# Patient Record
Sex: Female | Born: 1965 | Race: Black or African American | Hispanic: No | Marital: Single | Smoking: Never smoker
Health system: Southern US, Community
[De-identification: ages and names within clinical notes are randomized; demographics above are authoritative.]

## PROBLEM LIST (undated history)

## (undated) DIAGNOSIS — J189 Pneumonia, unspecified organism: Secondary | ICD-10-CM

## (undated) DIAGNOSIS — E538 Deficiency of other specified B group vitamins: Secondary | ICD-10-CM

## (undated) DIAGNOSIS — T4145XA Adverse effect of unspecified anesthetic, initial encounter: Secondary | ICD-10-CM

## (undated) DIAGNOSIS — F5104 Psychophysiologic insomnia: Secondary | ICD-10-CM

## (undated) DIAGNOSIS — J45909 Unspecified asthma, uncomplicated: Secondary | ICD-10-CM

## (undated) DIAGNOSIS — Z8673 Personal history of transient ischemic attack (TIA), and cerebral infarction without residual deficits: Secondary | ICD-10-CM

## (undated) DIAGNOSIS — G43709 Chronic migraine without aura, not intractable, without status migrainosus: Secondary | ICD-10-CM

## (undated) DIAGNOSIS — G510 Bell's palsy: Secondary | ICD-10-CM

## (undated) DIAGNOSIS — G4733 Obstructive sleep apnea (adult) (pediatric): Secondary | ICD-10-CM

## (undated) DIAGNOSIS — R51 Headache: Secondary | ICD-10-CM

## (undated) DIAGNOSIS — I639 Cerebral infarction, unspecified: Secondary | ICD-10-CM

## (undated) DIAGNOSIS — R519 Headache, unspecified: Secondary | ICD-10-CM

## (undated) DIAGNOSIS — T8859XA Other complications of anesthesia, initial encounter: Secondary | ICD-10-CM

## (undated) DIAGNOSIS — K219 Gastro-esophageal reflux disease without esophagitis: Secondary | ICD-10-CM

## (undated) HISTORY — PX: ABDOMINAL HYSTERECTOMY: SHX81

## (undated) HISTORY — PX: PARTIAL HYSTERECTOMY: SHX80

## (undated) HISTORY — DX: Chronic migraine without aura, not intractable, without status migrainosus: G43.709

## (undated) HISTORY — DX: Psychophysiologic insomnia: F51.04

## (undated) HISTORY — DX: Headache: R51

## (undated) HISTORY — PX: THYROIDECTOMY, PARTIAL: SHX18

## (undated) HISTORY — DX: Obstructive sleep apnea (adult) (pediatric): G47.33

## (undated) HISTORY — DX: Gastro-esophageal reflux disease without esophagitis: K21.9

## (undated) HISTORY — DX: Headache, unspecified: R51.9

## (undated) HISTORY — DX: Bell's palsy: G51.0

## (undated) HISTORY — DX: Personal history of transient ischemic attack (TIA), and cerebral infarction without residual deficits: Z86.73

## (undated) HISTORY — DX: Deficiency of other specified B group vitamins: E53.8

---

## 1987-06-19 DIAGNOSIS — I639 Cerebral infarction, unspecified: Secondary | ICD-10-CM

## 1987-06-19 HISTORY — DX: Cerebral infarction, unspecified: I63.9

## 2004-07-27 ENCOUNTER — Encounter: Admission: RE | Admit: 2004-07-27 | Discharge: 2004-07-27 | Payer: Self-pay | Admitting: Internal Medicine

## 2007-07-06 ENCOUNTER — Emergency Department (HOSPITAL_COMMUNITY): Admission: EM | Admit: 2007-07-06 | Discharge: 2007-07-06 | Payer: Self-pay | Admitting: Emergency Medicine

## 2011-06-19 DIAGNOSIS — J189 Pneumonia, unspecified organism: Secondary | ICD-10-CM

## 2011-06-19 HISTORY — DX: Pneumonia, unspecified organism: J18.9

## 2011-10-02 ENCOUNTER — Other Ambulatory Visit: Payer: Self-pay | Admitting: Family Medicine

## 2011-10-02 DIAGNOSIS — R600 Localized edema: Secondary | ICD-10-CM

## 2011-10-03 ENCOUNTER — Ambulatory Visit
Admission: RE | Admit: 2011-10-03 | Discharge: 2011-10-03 | Disposition: A | Discharge status: Home / Self Care | Payer: BC Managed Care – PPO | Source: Ambulatory Visit | Attending: Family Medicine | Admitting: Family Medicine

## 2011-10-03 DIAGNOSIS — R600 Localized edema: Secondary | ICD-10-CM

## 2013-05-23 ENCOUNTER — Encounter (HOSPITAL_COMMUNITY): Payer: Self-pay | Admitting: Emergency Medicine

## 2013-05-23 ENCOUNTER — Emergency Department (INDEPENDENT_AMBULATORY_CARE_PROVIDER_SITE_OTHER): Payer: PRIVATE HEALTH INSURANCE

## 2013-05-23 ENCOUNTER — Emergency Department (INDEPENDENT_AMBULATORY_CARE_PROVIDER_SITE_OTHER)
Admission: EM | Admit: 2013-05-23 | Discharge: 2013-05-23 | Disposition: A | Discharge status: Home / Self Care | Payer: PRIVATE HEALTH INSURANCE | Source: Home / Self Care | Attending: Emergency Medicine | Admitting: Emergency Medicine

## 2013-05-23 DIAGNOSIS — S39012A Strain of muscle, fascia and tendon of lower back, initial encounter: Secondary | ICD-10-CM

## 2013-05-23 DIAGNOSIS — J45901 Unspecified asthma with (acute) exacerbation: Secondary | ICD-10-CM

## 2013-05-23 DIAGNOSIS — S335XXA Sprain of ligaments of lumbar spine, initial encounter: Secondary | ICD-10-CM

## 2013-05-23 HISTORY — DX: Unspecified asthma, uncomplicated: J45.909

## 2013-05-23 MED ORDER — ALBUTEROL SULFATE HFA 108 (90 BASE) MCG/ACT IN AERS: 1.0000 | INHALATION_SPRAY | RESPIRATORY_TRACT | Status: DC | PRN

## 2013-05-23 MED ORDER — PREDNISONE 20 MG PO TABS
ORAL_TABLET | ORAL | Status: AC
  Filled 2013-05-23: qty 3

## 2013-05-23 MED ORDER — PREDNISONE 20 MG PO TABS: 40.0000 mg | ORAL_TABLET | Freq: Every day | ORAL | Status: DC

## 2013-05-23 MED ORDER — PREDNISONE 20 MG PO TABS
60.0000 mg | ORAL_TABLET | Freq: Once | ORAL | Status: AC
  Administered 2013-05-23: 60 mg via ORAL

## 2013-05-23 MED ORDER — CYCLOBENZAPRINE HCL 10 MG PO TABS: 10.0000 mg | ORAL_TABLET | Freq: Three times a day (TID) | ORAL | Status: DC | PRN
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
## 2013-05-29 NOTE — ED Provider Notes (Signed)
Medical screening examination/treatment/procedure(s) were performed by non-physician practitioner and as supervising physician I was immediately available for consultation/collaboration.  Leslee Home, M.D.  Reuben Likes, MD 05/29/13 2135
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
## 2013-07-07 ENCOUNTER — Encounter (HOSPITAL_COMMUNITY): Payer: Self-pay | Admitting: Emergency Medicine

## 2013-07-07 ENCOUNTER — Emergency Department (INDEPENDENT_AMBULATORY_CARE_PROVIDER_SITE_OTHER): Payer: Self-pay

## 2013-07-07 ENCOUNTER — Emergency Department (INDEPENDENT_AMBULATORY_CARE_PROVIDER_SITE_OTHER)
Admission: EM | Admit: 2013-07-07 | Discharge: 2013-07-07 | Disposition: A | Discharge status: Home / Self Care | Payer: Self-pay | Source: Home / Self Care

## 2013-07-07 DIAGNOSIS — J069 Acute upper respiratory infection, unspecified: Secondary | ICD-10-CM

## 2013-07-07 MED ORDER — ALBUTEROL SULFATE HFA 108 (90 BASE) MCG/ACT IN AERS: 1.0000 | INHALATION_SPRAY | Freq: Four times a day (QID) | RESPIRATORY_TRACT | Status: DC | PRN

## 2013-07-07 MED ORDER — DEXTROMETHORPHAN POLISTIREX 30 MG/5ML PO LQCR: 30.0000 mg | Freq: Two times a day (BID) | ORAL | Status: DC | PRN

## 2013-07-07 MED ORDER — IPRATROPIUM BROMIDE 0.06 % NA SOLN: 2.0000 | Freq: Four times a day (QID) | NASAL | Status: DC

## 2013-07-07 NOTE — ED Provider Notes (Cosign Needed)
CSN: [id number]     Arrival date & time 07/07/13  1741 History   None    Chief Complaint  Patient presents with  . URI   (Consider location/radiation/quality/duration/timing/severity/associated sxs/prior Treatment) Patient is a 48 y.o. female presenting with URI.  URI Presenting symptoms: congestion, cough, fever and rhinorrhea   Severity:  Moderate Onset quality:  Gradual Duration:  4 days Progression:  Unchanged Chronicity:  New Associated symptoms: headaches and swollen glands   Associated symptoms: no arthralgias, no myalgias, no neck pain, no sinus pain, no sneezing and no wheezing   Associated symptoms comment:  +chills Risk factors: sick contacts   Risk factors: no immunosuppression   Risk factors comment:  +friend ill with same   Past Medical History  Diagnosis Date  . Asthma    Past Surgical History  Procedure Laterality Date  . Partial hysterectomy    . Abdominal hysterectomy    . Thyroidectomy, partial     History reviewed. No pertinent family history. History  Substance Use Topics  . Smoking status: Never Smoker   . Smokeless tobacco: Not on file  . Alcohol Use: No   OB History   Grav Para Term Preterm Abortions TAB SAB Ect Mult Living                 Review of Systems  Constitutional: Positive for fever.  HENT: Positive for congestion and rhinorrhea. Negative for sneezing.   Respiratory: Positive for cough. Negative for wheezing.   Musculoskeletal: Negative for arthralgias, myalgias and neck pain.  Neurological: Positive for headaches.  All other systems reviewed and are negative.    Allergies  Review of patient's allergies indicates no known allergies.  Home Medications   Current Outpatient Rx  Name  Route  Sig  Dispense  Refill  . albuterol (PROVENTIL HFA;VENTOLIN HFA) 108 (90 BASE) MCG/ACT inhaler   Inhalation   Inhale 1-2 puffs into the lungs every 4 (four) hours as needed for wheezing or shortness of breath (and or cough).   1  Inhaler   0   . Pseudoeph-Doxylamine-DM-APAP (NYQUIL PO)   Oral   Take by mouth.         . Albuterol Sulfate (VENTOLIN HFA IN)   Inhalation   Inhale into the lungs.         . cyclobenzaprine (FLEXERIL) 10 MG tablet   Oral   Take 1 tablet (10 mg total) by mouth 3 (three) times daily as needed for muscle spasms.   20 tablet   0   . mometasone (NASONEX) 50 MCG/ACT nasal spray   Nasal   Place 2 sprays into the nose daily.         . predniSONE (DELTASONE) 20 MG tablet   Oral   Take 2 tablets (40 mg total) by mouth daily with breakfast. For 5 days   10 tablet   0   . Pseudoeph-CPM-DM-APAP (TYLENOL COLD PO)   Oral   Take by mouth as needed.          BP 125/89  Pulse 93  Temp(Src) 100.2 F (37.9 C) (Oral)  Resp 24  SpO2 96% Physical Exam  Nursing note and vitals reviewed. Constitutional: She is oriented to person, place, and time. She appears well-developed and well-nourished. No distress.  HENT:  Head: Normocephalic and atraumatic.  Right Ear: Hearing, tympanic membrane, external ear and ear canal normal.  Left Ear: Hearing, tympanic membrane, external ear and ear canal normal.  Nose: Nose normal.  Mouth/Throat: Uvula  is midline, oropharynx is clear and moist and mucous membranes are normal.  Eyes: Conjunctivae are normal. Right eye exhibits no discharge. Left eye exhibits no discharge. No scleral icterus.  Neck: Normal range of motion. Neck supple.  Cardiovascular: Normal rate, regular rhythm and normal heart sounds.   Pulmonary/Chest: Effort normal and breath sounds normal. No respiratory distress. She has no wheezes. She exhibits no tenderness.  Abdominal: Soft. Bowel sounds are normal. She exhibits no distension. There is no tenderness.  Musculoskeletal: Normal range of motion.  Lymphadenopathy:    She has no cervical adenopathy.  Neurological: She is alert and oriented to person, place, and time.  Skin: Skin is warm and dry. No rash noted.  Psychiatric:  She has a normal mood and affect. Her behavior is normal.    ED Course  Procedures (including critical care time) Labs Review Labs Reviewed - No data to display Imaging Review No results found.  EKG Interpretation    Date/Time:    Ventricular Rate:    PR Interval:    QRS Duration:   QT Interval:    QTC Calculation:   R Axis:     Text Interpretation:              MDM  Will provide atrovent nasal spray for congestion and delsym for cough. Tylenol or ibuprofen as directed on packing for fever and body aches. Plenty of fluids and rest. May return to work when 24 hours without fever. Will provide additional Rx for albuterol MDI for use as she recovers from URI.     Jess BartersJennifer Lee MorrowPresson, GeorgiaPA 07/07/13 2055

## 2013-07-07 NOTE — Discharge Instructions (Signed)
Your chest xray was normal. Plenty of fluids and rest. Tylenol or ibuprofen as directed on packaging for fever and body aches.

## 2013-07-07 NOTE — ED Notes (Signed)
C/o cough and congestion in nose and mouth and eyes onset 2 weeks ago.  Fever onset yesterday.  Cough prod. of clear, greenish and occasionally blood tinged.  Ribs hurt from coughing.  C/o pain L side of her neck, throat and head from coughing.

## 2013-09-10 ENCOUNTER — Other Ambulatory Visit: Payer: Self-pay | Admitting: *Deleted

## 2013-09-10 DIAGNOSIS — M7989 Other specified soft tissue disorders: Secondary | ICD-10-CM

## 2013-10-19 ENCOUNTER — Encounter: Payer: Self-pay | Admitting: Vascular Surgery

## 2013-10-20 ENCOUNTER — Ambulatory Visit (HOSPITAL_COMMUNITY): Admission: RE | Admit: 2013-10-20 | Payer: Self-pay | Source: Ambulatory Visit

## 2013-10-20 ENCOUNTER — Encounter: Payer: No Typology Code available for payment source | Admitting: Vascular Surgery

## 2014-07-27 ENCOUNTER — Other Ambulatory Visit: Payer: Self-pay | Admitting: *Deleted

## 2014-07-27 DIAGNOSIS — M7989 Other specified soft tissue disorders: Secondary | ICD-10-CM

## 2014-07-27 DIAGNOSIS — R609 Edema, unspecified: Secondary | ICD-10-CM

## 2014-08-02 ENCOUNTER — Ambulatory Visit: Payer: 59 | Admitting: Neurology

## 2014-08-05 ENCOUNTER — Encounter: Payer: Self-pay | Admitting: Neurology

## 2014-08-05 ENCOUNTER — Ambulatory Visit (INDEPENDENT_AMBULATORY_CARE_PROVIDER_SITE_OTHER): Payer: 59 | Admitting: Neurology

## 2014-08-05 VITALS — BP 136/78 | HR 79 | Ht 65.0 in | Wt 222.6 lb

## 2014-08-05 DIAGNOSIS — G43709 Chronic migraine without aura, not intractable, without status migrainosus: Secondary | ICD-10-CM

## 2014-08-05 DIAGNOSIS — F5104 Psychophysiologic insomnia: Secondary | ICD-10-CM | POA: Insufficient documentation

## 2014-08-05 DIAGNOSIS — G4700 Insomnia, unspecified: Secondary | ICD-10-CM

## 2014-08-05 DIAGNOSIS — IMO0002 Reserved for concepts with insufficient information to code with codable children: Secondary | ICD-10-CM

## 2014-08-05 HISTORY — DX: Reserved for concepts with insufficient information to code with codable children: IMO0002

## 2014-08-05 MED ORDER — SUMATRIPTAN SUCCINATE 100 MG PO TABS: 100.0000 mg | ORAL_TABLET | Freq: Once | ORAL | Status: AC | PRN

## 2014-08-05 MED ORDER — NORTRIPTYLINE HCL 10 MG PO CAPS: ORAL_CAPSULE | ORAL | Status: AC

## 2014-08-05 NOTE — Progress Notes (Signed)
Reason for visit: Migraine headache  Donna Murray is a 49 y.o. female  History of present illness:  Ms. Donna Murray is a 49 year old left-handed black female with a history of headaches that date back about 10 years. The patient indicates that her headaches are daily in nature, and she will wake up with a headache, and headache is worse as the day goes on. Some days are worse than others with the headache. She indicates that the headaches are always on the right temporal and parietal area, and are associated with throbbing sensation, occasional pressure sensations. She will occasionally have some neck stiffness. Bright lights and certain odors may worsen headache. She may have photophobia and phonophobia with the headache. She has developed some tinnitus in the right ear. She has recently has some swelling in the left leg. She denies any bowel or bladder control issues, balance problems, or numbness or weakness of the extremities. She indicates that she has had Bell's palsy on the right previously, and she was told she had a stroke during her pregnancy associated with high blood pressure. 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 The patient is not taking any medications for the headache. She comes to this office for an evaluation. The patient does report some chronic issues with insomnia. She also reports some issues with depression.  Past Medical History  Diagnosis Date  . Asthma   . Headache   . Chronic migraine 08/05/2014  . Chronic insomnia   . B12 deficiency   . GERD (gastroesophageal reflux disease)   . Bell's palsy     right  . History of stroke     right hemisensory deficit    Past Surgical History  Procedure Laterality Date  . Partial hysterectomy    . Abdominal hysterectomy    . Thyroidectomy, partial      Family History  Problem Relation Age of Onset  . Brain cancer Mother   . Hypertension Father   . Diabetes Father   . Heart disease Father   .  Migraines Sister   . Brain cancer Maternal Aunt   . Brain cancer Maternal Aunt   . Migraines Sister     Social history:  reports that she has never smoked. She has never used smokeless tobacco. She reports that she drinks alcohol. She reports that she does not use illicit drugs.  Medications:  Current Outpatient Prescriptions on File Prior to Visit  Medication Sig Dispense Refill  . albuterol (PROVENTIL HFA;VENTOLIN HFA) 108 (90 BASE) MCG/ACT inhaler Inhale 1-2 puffs into the lungs every 6 (six) hours as needed for wheezing or shortness of breath. 1 Inhaler 0  . cyclobenzaprine (FLEXERIL) 10 MG tablet Take 1 tablet (10 mg total) by mouth 3 (three) times daily as needed for muscle spasms. 20 tablet 0  . mometasone (NASONEX) 50 MCG/ACT nasal spray Place 2 sprays into the nose daily.     No current facility-administered medications on file prior to visit.     No Known Allergies  ROS:  Out of a complete 14 system review of symptoms, the patient complains only of the following symptoms, and all other reviewed systems are negative.  Weight gain, fatigue Swelling in the legs Hearing loss, ringing the ears, spinning sensations Blurred vision, double vision, eye pain Snoring Feeling hot, increased thirst Muscle cramps, aching muscles Allergies, runny nose Memory loss, headache, numbness, dizziness Depression, not enough sleep, decreased energy, change in appetite, racing thoughts Sleepiness  Blood pressure 136/78, pulse  79, height  (1.651 m), weight 222 lb 9.6 oz (100.971 kg).  Physical Exam  General: The patient is alert and cooperative at the time of the examination. The patient is minimally to moderately obese.  Eyes: Pupils are equal, round, and reactive to light. Discs are flat bilaterally.  Neck: The neck is supple, no carotid bruits are noted.  Respiratory: The respiratory examination is clear.  Cardiovascular: The cardiovascular examination reveals a regular rate  and rhythm, no obvious murmurs or rubs are noted.  Skin: Extremities are with 1+ edema below the knee on the right, 2+ edema on the left.  Neurologic Exam  Mental status: The patient is alert and oriented x 3 at the time of the examination. The patient has apparent normal recent and remote memory, with an apparently normal attention span and concentration ability.  Cranial nerves: Facial symmetry is not present. There is a relative depression of the left nasolabial fold as compared to the right. There is good sensation of the face to pinprick and soft touch bilaterally. The strength of the facial muscles and the muscles to head turning and shoulder shrug are normal bilaterally. Speech is well enunciated, no aphasia or dysarthria is noted. Extraocular movements are full. Visual fields are full. The tongue is midline, and the patient has symmetric elevation of the soft palate. No obvious hearing deficits are noted.  Motor: The motor testing reveals 5 over 5 strength of all 4 extremities. Good symmetric motor tone is noted throughout.  Sensory: Sensory testing is intact to pinprick, soft touch, vibration sensation, and position sense on all 4 extremities. No evidence of extinction is noted.  Coordination: Cerebellar testing reveals good finger-nose-finger and heel-to-shin bilaterally.  Gait and station: Gait is normal. Tandem gait is normal. Romberg is negative. No drift is seen.  Reflexes: Deep tendon reflexes are symmetric and normal bilaterally. Toes are downgoing bilaterally.   Assessment/Plan:  1. Migraine headache  2. Chronic insomnia  3. History of stroke  The patient is having chronic daily headaches. Given the history of stroke, and the fact that the headaches are always on the right side, the patient will be set up for MRI evaluation of the brain. She will be placed on nortriptyline at night for the headache, and Imitrex will be taken if needed for severe headaches. The patient  will follow-up in about 4 months. The patient will contact our office if she cannot tolerate the medication.   Marlan Palau MD 08/05/2014 8:37 PM  [facility] [address] [place], [place] [address]  Phone [phone] Fax [phone]

## 2014-08-05 NOTE — Patient Instructions (Addendum)
 Pamelor (nortriptyline) is an antidepressant medication that has many uses that may include headache, whiplash injuries, or for peripheral neuropathy pain. Side effects may include drowsiness, dry mouth, blurred vision, or constipation. As with any antidepressant medication, worsening depression may occur. If you had any significant side effects, please call our office. The full effects of this medication may take 7-10 days after starting the drug, or going up on the dose.  Migraine Headache A migraine headache is an intense, throbbing pain on one or both sides of your head. A migraine can last for 30 minutes to several hours. CAUSES  The exact cause of a migraine headache is not always known. However, a migraine may be caused when nerves in the brain become irritated and release chemicals that cause inflammation. This causes pain. Certain things may also trigger migraines, such as:  Alcohol.  Smoking.  Stress.  Menstruation.  Aged cheeses.  Foods or drinks that contain nitrates, glutamate, aspartame, or tyramine.  Lack of sleep.  Chocolate.  Caffeine.  Hunger.  Physical exertion.  Fatigue.  Medicines used to treat chest pain (nitroglycerine), birth control pills, estrogen, and some blood pressure medicines. SIGNS AND SYMPTOMS  Pain on one or both sides of your head.  Pulsating or throbbing pain.  Severe pain that prevents daily activities.  Pain that is aggravated by any physical activity.  Nausea, vomiting, or both.  Dizziness.  Pain with exposure to bright lights, loud noises, or activity.  General sensitivity to bright lights, loud noises, or smells. Before you get a migraine, you may get warning signs that a migraine is coming (aura). An aura may include:  Seeing flashing lights.  Seeing bright spots, halos, or zigzag lines.  Having tunnel vision or blurred vision.  Having feelings of numbness or tingling.  Having trouble talking.  Having muscle  weakness. DIAGNOSIS  A migraine headache is often diagnosed based on:  Symptoms.  Physical exam.  A CT scan or MRI of your head. These imaging tests cannot diagnose migraines, but they can help rule out other causes of headaches. TREATMENT Medicines may be given for pain and nausea. Medicines can also be given to help prevent recurrent migraines.  HOME CARE INSTRUCTIONS  Only take over-the-counter or prescription medicines for pain or discomfort as directed by your health care provider. The use of long-term narcotics is not recommended.  Lie down in a dark, quiet room when you have a migraine.  Keep a journal to find out what may trigger your migraine headaches. For example, write down:  What you eat and drink.  How much sleep you get.  Any change to your diet or medicines.  Limit alcohol consumption.  Quit smoking if you smoke.  Get 7-9 hours of sleep, or as recommended by your health care provider.  Limit stress.  Keep lights dim if bright lights bother you and make your migraines worse. SEEK IMMEDIATE MEDICAL CARE IF:   Your migraine becomes severe.  You have a fever.  You have a stiff neck.  You have vision loss.  You have muscular weakness or loss of muscle control.  You start losing your balance or have trouble walking.  You feel faint or pass out.  You have severe symptoms that are different from your first symptoms. MAKE SURE YOU:   Understand these instructions.  Will watch your condition.  Will get help right away if you are not doing well or get worse. Document Released: 06/04/2005 Document Revised: 10/19/2013 Document Reviewed: 02/09/2013 [employer]   Patient Information 2015 [employer]. This information is not intended to replace advice given to you by your health care provider. Make sure you discuss any questions you have with your health care provider.  

## 2014-08-11 ENCOUNTER — Ambulatory Visit (INDEPENDENT_AMBULATORY_CARE_PROVIDER_SITE_OTHER): Payer: 59

## 2014-08-11 DIAGNOSIS — IMO0002 Reserved for concepts with insufficient information to code with codable children: Secondary | ICD-10-CM

## 2014-08-11 DIAGNOSIS — G43709 Chronic migraine without aura, not intractable, without status migrainosus: Secondary | ICD-10-CM | POA: Diagnosis not present

## 2014-08-16 ENCOUNTER — Telehealth: Payer: Self-pay | Admitting: Neurology

## 2014-08-16 NOTE — Telephone Encounter (Signed)
I called patient. MRI the brain was completely normal. I discussed this with her.   MRI brain 08/16/14:  IMPRESSION: This is a normal noncontrasted MRI of the brain

## 2014-08-18 ENCOUNTER — Encounter: Payer: Self-pay | Admitting: Vascular Surgery

## 2014-08-19 ENCOUNTER — Ambulatory Visit (INDEPENDENT_AMBULATORY_CARE_PROVIDER_SITE_OTHER): Payer: 59 | Admitting: Vascular Surgery

## 2014-08-19 ENCOUNTER — Other Ambulatory Visit: Payer: Self-pay

## 2014-08-19 ENCOUNTER — Encounter: Payer: Self-pay | Admitting: Vascular Surgery

## 2014-08-19 ENCOUNTER — Ambulatory Visit (HOSPITAL_COMMUNITY)
Admission: RE | Admit: 2014-08-19 | Discharge: 2014-08-19 | Disposition: A | Discharge status: Home / Self Care | Payer: 59 | Source: Ambulatory Visit | Attending: Vascular Surgery | Admitting: Vascular Surgery

## 2014-08-19 VITALS — BP 135/85 | HR 82 | Temp 98.1°F | Resp 18 | Ht 65.0 in | Wt 222.0 lb

## 2014-08-19 DIAGNOSIS — I872 Venous insufficiency (chronic) (peripheral): Secondary | ICD-10-CM | POA: Diagnosis not present

## 2014-08-19 DIAGNOSIS — M7989 Other specified soft tissue disorders: Secondary | ICD-10-CM

## 2014-08-19 DIAGNOSIS — R609 Edema, unspecified: Secondary | ICD-10-CM

## 2014-08-19 NOTE — Progress Notes (Signed)
[facility] [place] HISTORY AND PHYSICAL   History of Present Illness:  Patient is a 49 y.o. year old female who presents for evaluation of chronic left leg swelling.  The patient states she has had swelling in her left leg has been present for several years. This began after having her fourth child several years ago. However, she states that in the last year the swelling has become worse. She has been wearing compression stockings for approximately one year. She denies any prior history of DVT. She denies any prior history of lower extremity injury. She does not really complain of varicose veins. She does have some pain in her left foot on the arch area when walking. She works 3 jobs and is on her feet all day and her legs become progressively more tired heavily and fatigued as the day progresses. 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 These are all currently stable.  Past Medical History  Diagnosis Date  . Asthma   . Headache   . Chronic migraine 08/05/2014  . Chronic insomnia   . B12 deficiency   . GERD (gastroesophageal reflux disease)   . Bell's palsy     right  . History of stroke     right hemisensory deficit    Past Surgical History  Procedure Laterality Date  . Partial hysterectomy    . Abdominal hysterectomy    . Thyroidectomy, partial      Social History History  Substance Use Topics  . Smoking status: Never Smoker   . Smokeless tobacco: Never Used  . Alcohol Use: Yes     Comment: occasional wine    Family History Family History  Problem Relation Age of Onset  . Brain cancer Mother   . Hypertension Father   . Diabetes Father   . Heart disease Father   . Migraines Sister   . Brain cancer Maternal Aunt   . Brain cancer Maternal Aunt   . Migraines Sister     Allergies  No Known Allergies   Current Outpatient Prescriptions  Medication Sig Dispense Refill  . albuterol  (PROVENTIL HFA;VENTOLIN HFA) 108 (90 BASE) MCG/ACT inhaler Inhale 1-2 puffs into the lungs every 6 (six) hours as needed for wheezing or shortness of breath. 1 Inhaler 0  . cyclobenzaprine (FLEXERIL) 10 MG tablet Take 1 tablet (10 mg total) by mouth 3 (three) times daily as needed for muscle spasms. 20 tablet 0  . furosemide (LASIX) 20 MG tablet Take 20 mg by mouth 2 (two) times daily.   3  . loratadine (CLARITIN) 10 MG tablet Take 10 mg by mouth daily.    . mometasone (NASONEX) 50 MCG/ACT nasal spray Place 2 sprays into the nose daily.    . montelukast (SINGULAIR) 10 MG tablet Take 10 mg by mouth at bedtime.    . nortriptyline (PAMELOR) 10 MG capsule Take one capsule at night for one week, then take 2 capsules at night for one week, then take 3 capsules at night 90 capsule 3  . omeprazole (PRILOSEC) 40 MG capsule Take 40 mg by mouth daily.  0  . potassium chloride (MICRO-K) 10 MEQ CR capsule Take 10 mEq by mouth daily.  3  . SUMAtriptan (IMITREX) 100 MG tablet Take 1 tablet (100 mg total) by mouth once as needed for migraine or headache. 10 tablet 2   No current facility-administered medications for this visit.    ROS:   General:  No weight loss, Fever, chills  HEENT: No recent headaches, no nasal bleeding, no visual changes, no sore throat  Neurologic: No dizziness, blackouts, seizures. No recent symptoms of stroke or mini- stroke. No recent episodes of slurred speech, or temporary blindness.  Cardiac: No recent episodes of chest pain/pressure, no shortness of breath at rest.  No shortness of breath with exertion.  Denies history of atrial fibrillation or irregular heartbeat  Vascular: No history of rest pain in feet.  No history of claudication.  No history of non-healing ulcer, No history of DVT   Pulmonary: No home oxygen, no productive cough, no hemoptysis,  No asthma or wheezing  Musculoskeletal:  [ ]  Arthritis, [ ]  Low back pain,  [ ]  Joint pain  Hematologic:No history of  hypercoagulable state.  No history of easy bleeding.  No history of anemia  Gastrointestinal: No hematochezia or melena,  No gastroesophageal reflux, no trouble swallowing  Urinary: [ ]  chronic Kidney disease, [ ]  on HD - [ ]  MWF or [ ]  TTHS, [ ]  Burning with urination, [ ]  Frequent urination, [ ]  Difficulty urinating;   Skin: No rashes  Psychological: No history of anxiety,  No history of depression   Physical Examination  Filed Vitals:   08/19/14 1157  BP: 135/85  Pulse: 82  Temp: 98.1 F (36.7 C)  TempSrc: Oral  Resp: 18  Height: 5\' 5"  (1.651 m)  Weight: 222 lb (100.699 kg)  SpO2: 100%    Body mass index is 36.94 kg/(m^2).  General:  Alert and oriented, no acute distress HEENT: Normal Neck: No bruit or JVD Pulmonary: Clear to auscultation bilaterally Cardiac: Regular Rate and Rhythm without murmur Abdomen: Soft, non-tender, non-distended, no mass Skin: No rash Extremity Pulses:  2+ radial, brachial, femoral, dorsalis pedis, posterior tibial pulses bilaterally Musculoskeletal: Left leg circumference proximal mid 20% larger than the left there is some mild pitting component she does not exhibit a buffalo hump in the left leg. There is no real squaring of the toes. Neurologic: Upper and lower extremity motor 5/5 and symmetric  DATA:  Patient had a left leg venous reflux exam today which I reviewed and interpreted. There was no DVT. She did have some deep vein reflux in the left popliteal vein. The greater saphenous vein had no reflux.   ASSESSMENT:  Chronic left leg swelling with no significant superficial venous reflux. She does have mild deep vein reflux. 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  PLAN:  Left lower extremity venogram possible angioplasty and stenting of her left iliac vein if a finding is noted. Risks benefits possible, locations and procedure  details were explained the patient today. Risks include but are not limited to bleeding and infection or contrast reaction. Her venogram scheduled for 09/03/2014.  Fabienne Brunsharles Mirel Hundal, MD [facility] [place] Office: [phone] Pager: [phone]

## 2014-08-20 ENCOUNTER — Other Ambulatory Visit: Payer: Self-pay

## 2014-08-24 ENCOUNTER — Encounter (HOSPITAL_COMMUNITY): Payer: Self-pay | Admitting: *Deleted

## 2014-08-24 MED ORDER — SODIUM CHLORIDE 0.9 % IV SOLN: INTRAVENOUS | Status: DC

## 2014-08-24 MED ORDER — DEXTROSE 5 % IV SOLN: 1.5000 g | INTRAVENOUS | Status: DC

## 2014-08-25 ENCOUNTER — Ambulatory Visit (HOSPITAL_COMMUNITY): Payer: 59 | Admitting: Anesthesiology

## 2014-08-25 ENCOUNTER — Ambulatory Visit (HOSPITAL_COMMUNITY)
Admission: RE | Admit: 2014-08-25 | Discharge: 2014-08-25 | Disposition: A | Discharge status: Home / Self Care | Payer: 59 | Source: Ambulatory Visit | Attending: Vascular Surgery | Admitting: Vascular Surgery

## 2014-08-25 ENCOUNTER — Other Ambulatory Visit: Payer: Self-pay | Admitting: *Deleted

## 2014-08-25 ENCOUNTER — Encounter (HOSPITAL_COMMUNITY)
Admission: RE | Disposition: A | Discharge status: Home / Self Care | Payer: Self-pay | Source: Ambulatory Visit | Attending: Vascular Surgery

## 2014-08-25 ENCOUNTER — Encounter (HOSPITAL_COMMUNITY): Payer: Self-pay | Admitting: *Deleted

## 2014-08-25 DIAGNOSIS — Z8673 Personal history of transient ischemic attack (TIA), and cerebral infarction without residual deficits: Secondary | ICD-10-CM | POA: Diagnosis not present

## 2014-08-25 DIAGNOSIS — I80202 Phlebitis and thrombophlebitis of unspecified deep vessels of left lower extremity: Secondary | ICD-10-CM | POA: Diagnosis not present

## 2014-08-25 DIAGNOSIS — G510 Bell's palsy: Secondary | ICD-10-CM | POA: Diagnosis not present

## 2014-08-25 DIAGNOSIS — Z90710 Acquired absence of both cervix and uterus: Secondary | ICD-10-CM | POA: Insufficient documentation

## 2014-08-25 DIAGNOSIS — I871 Compression of vein: Secondary | ICD-10-CM | POA: Diagnosis not present

## 2014-08-25 DIAGNOSIS — Z48812 Encounter for surgical aftercare following surgery on the circulatory system: Secondary | ICD-10-CM

## 2014-08-25 DIAGNOSIS — J45909 Unspecified asthma, uncomplicated: Secondary | ICD-10-CM | POA: Insufficient documentation

## 2014-08-25 DIAGNOSIS — K219 Gastro-esophageal reflux disease without esophagitis: Secondary | ICD-10-CM | POA: Diagnosis not present

## 2014-08-25 DIAGNOSIS — G43909 Migraine, unspecified, not intractable, without status migrainosus: Secondary | ICD-10-CM | POA: Diagnosis not present

## 2014-08-25 HISTORY — PX: ANGIOPLASTY ILLIAC ARTERY: SHX5720

## 2014-08-25 HISTORY — DX: Other complications of anesthesia, initial encounter: T88.59XA

## 2014-08-25 HISTORY — DX: Pneumonia, unspecified organism: J18.9

## 2014-08-25 HISTORY — PX: VENOPLASTY: SHX6195

## 2014-08-25 HISTORY — DX: Adverse effect of unspecified anesthetic, initial encounter: T41.45XA

## 2014-08-25 HISTORY — DX: Cerebral infarction, unspecified: I63.9

## 2014-08-25 LAB — CREATININE, SERUM
Creatinine, Ser: 0.74 mg/dL (ref 0.50–1.10)
GFR calc Af Amer: >90 mL/min (ref >90)

## 2014-08-25 LAB — CBC
HCT: 36.7 % (ref 36.0–46.0)
Hemoglobin: 12 g/dL (ref 12.0–15.0)
MCH: 29.3 pg (ref 26.0–34.0)
MCHC: 32.7 g/dL (ref 30.0–36.0)
MCV: 89.7 fL (ref 78.0–100.0)
Platelets: 219 10*3/uL (ref 150–400)
RBC: 4.09 MIL/uL (ref 3.87–5.11)
RDW: 12.7 % (ref 11.5–15.5)
WBC: 6.5 10*3/uL (ref 4.0–10.5)

## 2014-08-25 LAB — POCT ACTIVATED CLOTTING TIME: ACTIVATED CLOTTING TIME: 116 s

## 2014-08-25 LAB — POCT I-STAT 4, (NA,K, GLUC, HGB,HCT)
Glucose, Bld: 92 mg/dL (ref 70–99)
HCT: 40 % (ref 36.0–46.0)
Hemoglobin: 13.6 g/dL (ref 12.0–15.0)
Potassium: 4.1 mmol/L (ref 3.5–5.1)
Sodium: 141 mmol/L (ref 135–145)

## 2014-08-25 SURGERY — ANGIOPLASTY, VEIN
Anesthesia: Monitor Anesthesia Care | Site: Groin | Laterality: Left

## 2014-08-25 MED ORDER — MIDAZOLAM HCL 2 MG/2ML IJ SOLN
INTRAMUSCULAR | Status: AC
  Filled 2014-08-25: qty 2

## 2014-08-25 MED ORDER — ENOXAPARIN SODIUM 40 MG/0.4ML ~~LOC~~ SOLN: 100.0000 mg | SUBCUTANEOUS | Status: DC

## 2014-08-25 MED ORDER — RIVAROXABAN (XARELTO) VTE STARTER PACK (15 & 20 MG): ORAL_TABLET | ORAL | Status: DC

## 2014-08-25 MED ORDER — ENOXAPARIN (LOVENOX) PATIENT EDUCATION KIT
PACK | Freq: Once | Status: AC
  Administered 2014-08-25: 18:00:00
  Filled 2014-08-25: qty 1

## 2014-08-25 MED ORDER — FENTANYL CITRATE 0.05 MG/ML IJ SOLN
INTRAMUSCULAR | Status: AC
  Filled 2014-08-25: qty 5

## 2014-08-25 MED ORDER — OXYCODONE HCL 5 MG PO TABS: 5.0000 mg | ORAL_TABLET | ORAL | Status: DC | PRN

## 2014-08-25 MED ORDER — METOPROLOL TARTRATE 1 MG/ML IV SOLN
2.0000 mg | INTRAVENOUS | Status: DC | PRN
Start: 2014-08-25 — End: 2014-08-27
  Filled 2014-08-25: qty 5

## 2014-08-25 MED ORDER — LIDOCAINE HCL (PF) 1 % IJ SOLN
INTRAMUSCULAR | Status: AC
  Filled 2014-08-25: qty 30

## 2014-08-25 MED ORDER — RIVAROXABAN 20 MG PO TABS: 20.0000 mg | ORAL_TABLET | Freq: Every day | ORAL | Status: DC

## 2014-08-25 MED ORDER — PROTAMINE SULFATE 10 MG/ML IV SOLN
INTRAVENOUS | Status: AC
  Filled 2014-08-25: qty 25

## 2014-08-25 MED ORDER — ENOXAPARIN SODIUM 100 MG/ML ~~LOC~~ SOLN
1.0000 mg/kg | Freq: Two times a day (BID) | SUBCUTANEOUS | Status: DC
  Administered 2014-08-25: 100 mg via SUBCUTANEOUS
  Filled 2014-08-25 (×2): qty 1

## 2014-08-25 MED ORDER — SODIUM CHLORIDE 0.9 % IV SOLN: 500.0000 mL | Freq: Once | INTRAVENOUS | Status: AC | PRN

## 2014-08-25 MED ORDER — CHLORHEXIDINE GLUCONATE CLOTH 2 % EX PADS: 6.0000 | MEDICATED_PAD | Freq: Once | CUTANEOUS | Status: DC

## 2014-08-25 MED ORDER — ONDANSETRON HCL 4 MG/2ML IJ SOLN
INTRAMUSCULAR | Status: AC
  Filled 2014-08-25: qty 2

## 2014-08-25 MED ORDER — HYDRALAZINE HCL 20 MG/ML IJ SOLN
5.0000 mg | INTRAMUSCULAR | Status: DC | PRN
  Filled 2014-08-25: qty 0.25

## 2014-08-25 MED ORDER — SODIUM CHLORIDE 0.9 % IV SOLN: INTRAVENOUS | Status: DC

## 2014-08-25 MED ORDER — MORPHINE SULFATE 2 MG/ML IJ SOLN
2.0000 mg | INTRAMUSCULAR | Status: DC | PRN
  Administered 2014-08-25: 2 mg via INTRAVENOUS

## 2014-08-25 MED ORDER — LACTATED RINGERS IV SOLN: INTRAVENOUS | Status: DC

## 2014-08-25 MED ORDER — PROPOFOL 10 MG/ML IV BOLUS
INTRAVENOUS | Status: AC
  Filled 2014-08-25: qty 20

## 2014-08-25 MED ORDER — LABETALOL HCL 5 MG/ML IV SOLN
10.0000 mg | INTRAVENOUS | Status: DC | PRN
  Filled 2014-08-25: qty 4

## 2014-08-25 MED ORDER — ACETAMINOPHEN 325 MG PO TABS
325.0000 mg | ORAL_TABLET | ORAL | Status: DC | PRN
  Filled 2014-08-25: qty 2

## 2014-08-25 MED ORDER — SODIUM CHLORIDE 0.45 % IV SOLN
INTRAVENOUS | Status: DC
  Administered 2014-08-25: 15:00:00 via INTRAVENOUS

## 2014-08-25 MED ORDER — ALBUTEROL SULFATE HFA 108 (90 BASE) MCG/ACT IN AERS
INHALATION_SPRAY | RESPIRATORY_TRACT | Status: AC
  Filled 2014-08-25: qty 13.4

## 2014-08-25 MED ORDER — ONDANSETRON HCL 4 MG/2ML IJ SOLN
4.0000 mg | Freq: Four times a day (QID) | INTRAMUSCULAR | Status: DC | PRN
  Filled 2014-08-25: qty 2

## 2014-08-25 MED ORDER — ACETAMINOPHEN 325 MG RE SUPP
325.0000 mg | RECTAL | Status: DC | PRN
  Filled 2014-08-25: qty 2

## 2014-08-25 MED ORDER — HEPARIN SODIUM (PORCINE) 1000 UNIT/ML IJ SOLN
INTRAMUSCULAR | Status: AC
  Filled 2014-08-25: qty 1

## 2014-08-25 SURGICAL SUPPLY — 60 items
BANDAGE ESMARK 6X9 LF (GAUZE/BANDAGES/DRESSINGS) IMPLANT
BNDG CMPR 9X6 STRL LF SNTH (GAUZE/BANDAGES/DRESSINGS)
BNDG ESMARK 6X9 LF (GAUZE/BANDAGES/DRESSINGS)
CANISTER SUCTION 2500CC (MISCELLANEOUS) IMPLANT
CATH VISIONS PV .035 IVUS (CATHETERS) ×2 IMPLANT
CLIP TI MEDIUM 24 (CLIP) IMPLANT
CLIP TI WIDE RED SMALL 24 (CLIP) IMPLANT
CORONARY SUCKER SOFT TIP 10052 (MISCELLANEOUS) IMPLANT
COVER SURGICAL LIGHT HANDLE (MISCELLANEOUS) IMPLANT
CUFF TOURNIQUET SINGLE 24IN (TOURNIQUET CUFF) IMPLANT
CUFF TOURNIQUET SINGLE 34IN LL (TOURNIQUET CUFF) IMPLANT
CUFF TOURNIQUET SINGLE 44IN (TOURNIQUET CUFF) IMPLANT
DECANTER SPIKE VIAL GLASS SM (MISCELLANEOUS) IMPLANT
DRAIN SNY WOU (WOUND CARE) IMPLANT
DRAPE X-RAY CASS 24X20 (DRAPES) IMPLANT
DRSG COVADERM 4X10 (GAUZE/BANDAGES/DRESSINGS) IMPLANT
DRSG COVADERM 4X8 (GAUZE/BANDAGES/DRESSINGS) IMPLANT
ELECT REM PT RETURN 9FT ADLT (ELECTROSURGICAL)
ELECTRODE REM PT RTRN 9FT ADLT (ELECTROSURGICAL) IMPLANT
EVACUATOR SILICONE 100CC (DRAIN) IMPLANT
GAUZE SPONGE 4X4 12PLY STRL (GAUZE/BANDAGES/DRESSINGS) IMPLANT
GAUZE SPONGE 4X4 16PLY XRAY LF (GAUZE/BANDAGES/DRESSINGS) ×2 IMPLANT
GLOVE BIO SURGEON STRL SZ7.5 (GLOVE) ×2 IMPLANT
GLOVE ECLIPSE 7.5 STRL STRAW (GLOVE) ×2 IMPLANT
GOWN STRL REUS W/ TWL LRG LVL3 (GOWN DISPOSABLE) ×1 IMPLANT
GOWN STRL REUS W/ TWL XL LVL3 (GOWN DISPOSABLE) ×1 IMPLANT
GOWN STRL REUS W/TWL LRG LVL3 (GOWN DISPOSABLE) ×1
GOWN STRL REUS W/TWL XL LVL3 (GOWN DISPOSABLE) ×1
KIT BASIN OR (CUSTOM PROCEDURE TRAY) IMPLANT
KIT ROOM TURNOVER OR (KITS) IMPLANT
LIQUID BAND (GAUZE/BANDAGES/DRESSINGS) IMPLANT
NS IRRIG 1000ML POUR BTL (IV SOLUTION) IMPLANT
PACK PERIPHERAL VASCULAR (CUSTOM PROCEDURE TRAY) IMPLANT
PAD ARMBOARD 7.5X6 YLW CONV (MISCELLANEOUS) IMPLANT
PADDING CAST COTTON 6X4 STRL (CAST SUPPLIES) IMPLANT
SET COLLECT BLD 21X3/4 12 (NEEDLE) IMPLANT
SHEATH  FLEX ANSEL 8FRX45 (SHEATH) ×1
SHEATH FAST CATH 10F 12CM (SHEATH) ×4 IMPLANT
SHEATH FLEX ANSEL 8FRX45 (SHEATH) ×1 IMPLANT
SPONGE GAUZE 4X4 12PLY STER LF (GAUZE/BANDAGES/DRESSINGS) ×2 IMPLANT
SPONGE SURGIFOAM ABS GEL 100 (HEMOSTASIS) IMPLANT
STAPLER VISISTAT 35W (STAPLE) IMPLANT
STOPCOCK 4 WAY LG BORE MALE ST (IV SETS) IMPLANT
SUT PROLENE 5 0 C 1 24 (SUTURE) IMPLANT
SUT PROLENE 6 0 CC (SUTURE) IMPLANT
SUT PROLENE 7 0 BV 1 (SUTURE) IMPLANT
SUT PROLENE 7 0 BV1 MDA (SUTURE) IMPLANT
SUT SILK 2 0 SH (SUTURE) IMPLANT
SUT SILK 3 0 (SUTURE)
SUT SILK 3-0 18XBRD TIE 12 (SUTURE) IMPLANT
SUT VIC AB 2-0 CTX 36 (SUTURE) IMPLANT
SUT VIC AB 3-0 SH 27 (SUTURE)
SUT VIC AB 3-0 SH 27X BRD (SUTURE) IMPLANT
SUT VIC AB 4-0 PS2 27 (SUTURE) IMPLANT
TAPE CLOTH SURG 4X10 WHT LF (GAUZE/BANDAGES/DRESSINGS) ×2 IMPLANT
TAPE UMBILICAL COTTON 1/8X30 (MISCELLANEOUS) IMPLANT
TRAY FOLEY CATH 16FRSI W/METER (SET/KITS/TRAYS/PACK) IMPLANT
TUBING EXTENTION W/L.L. (IV SETS) IMPLANT
UNDERPAD 30X30 INCONTINENT (UNDERPADS AND DIAPERS) IMPLANT
WATER STERILE IRR 1000ML POUR (IV SOLUTION) IMPLANT

## 2014-08-25 NOTE — H&P (View-Only) (Signed)
[facility] [place] HISTORY AND PHYSICAL   History of Present Illness:  Patient is a 49 y.o. year old female who presents for evaluation of chronic left leg swelling.  The patient states she has had swelling in her left leg has been present for several years. This began after having her fourth child several years ago. However, she states that in the last year the swelling has become worse. She has been wearing compression stockings for approximately one year. She denies any prior history of DVT. She denies any prior history of lower extremity injury. She does not really complain of varicose veins. She does have some pain in her left foot on the arch area when walking. She works 3 jobs and is on her feet all day and her legs become progressively more tired heavily and fatigued as the day progresses. 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 These are all currently stable.  Past Medical History  Diagnosis Date  . Asthma   . Headache   . Chronic migraine 08/05/2014  . Chronic insomnia   . B12 deficiency   . GERD (gastroesophageal reflux disease)   . Bell's palsy     right  . History of stroke     right hemisensory deficit    Past Surgical History  Procedure Laterality Date  . Partial hysterectomy    . Abdominal hysterectomy    . Thyroidectomy, partial      Social History History  Substance Use Topics  . Smoking status: Never Smoker   . Smokeless tobacco: Never Used  . Alcohol Use: Yes     Comment: occasional wine    Family History Family History  Problem Relation Age of Onset  . Brain cancer Mother   . Hypertension Father   . Diabetes Father   . Heart disease Father   . Migraines Sister   . Brain cancer Maternal Aunt   . Brain cancer Maternal Aunt   . Migraines Sister     Allergies  No Known Allergies   Current Outpatient Prescriptions  Medication Sig Dispense Refill  . albuterol  (PROVENTIL HFA;VENTOLIN HFA) 108 (90 BASE) MCG/ACT inhaler Inhale 1-2 puffs into the lungs every 6 (six) hours as needed for wheezing or shortness of breath. 1 Inhaler 0  . cyclobenzaprine (FLEXERIL) 10 MG tablet Take 1 tablet (10 mg total) by mouth 3 (three) times daily as needed for muscle spasms. 20 tablet 0  . furosemide (LASIX) 20 MG tablet Take 20 mg by mouth 2 (two) times daily.   3  . loratadine (CLARITIN) 10 MG tablet Take 10 mg by mouth daily.    . mometasone (NASONEX) 50 MCG/ACT nasal spray Place 2 sprays into the nose daily.    . montelukast (SINGULAIR) 10 MG tablet Take 10 mg by mouth at bedtime.    . nortriptyline (PAMELOR) 10 MG capsule Take one capsule at night for one week, then take 2 capsules at night for one week, then take 3 capsules at night 90 capsule 3  . omeprazole (PRILOSEC) 40 MG capsule Take 40 mg by mouth daily.  0  . potassium chloride (MICRO-K) 10 MEQ CR capsule Take 10 mEq by mouth daily.  3  . SUMAtriptan (IMITREX) 100 MG tablet Take 1 tablet (100 mg total) by mouth once as needed for migraine or headache. 10 tablet 2   No current facility-administered medications for this visit.    ROS:   General:  No weight loss, Fever, chills  HEENT: No recent headaches, no nasal bleeding, no visual changes, no sore throat  Neurologic: No dizziness, blackouts, seizures. No recent symptoms of stroke or mini- stroke. No recent episodes of slurred speech, or temporary blindness.  Cardiac: No recent episodes of chest pain/pressure, no shortness of breath at rest.  No shortness of breath with exertion.  Denies history of atrial fibrillation or irregular heartbeat  Vascular: No history of rest pain in feet.  No history of claudication.  No history of non-healing ulcer, No history of DVT   Pulmonary: No home oxygen, no productive cough, no hemoptysis,  No asthma or wheezing  Musculoskeletal:  [ ]  Arthritis, [ ]  Low back pain,  [ ]  Joint pain  Hematologic:No history of  hypercoagulable state.  No history of easy bleeding.  No history of anemia  Gastrointestinal: No hematochezia or melena,  No gastroesophageal reflux, no trouble swallowing  Urinary: [ ]  chronic Kidney disease, [ ]  on HD - [ ]  MWF or [ ]  TTHS, [ ]  Burning with urination, [ ]  Frequent urination, [ ]  Difficulty urinating;   Skin: No rashes  Psychological: No history of anxiety,  No history of depression   Physical Examination  Filed Vitals:   08/19/14 1157  BP: 135/85  Pulse: 82  Temp: 98.1 F (36.7 C)  TempSrc: Oral  Resp: 18  Height: 5\' 5"  (1.651 m)  Weight: 222 lb (100.699 kg)  SpO2: 100%    Body mass index is 36.94 kg/(m^2).  General:  Alert and oriented, no acute distress HEENT: Normal Neck: No bruit or JVD Pulmonary: Clear to auscultation bilaterally Cardiac: Regular Rate and Rhythm without murmur Abdomen: Soft, non-tender, non-distended, no mass Skin: No rash Extremity Pulses:  2+ radial, brachial, femoral, dorsalis pedis, posterior tibial pulses bilaterally Musculoskeletal: Left leg circumference proximal mid 20% larger than the left there is some mild pitting component she does not exhibit a buffalo hump in the left leg. There is no real squaring of the toes. Neurologic: Upper and lower extremity motor 5/5 and symmetric  DATA:  Patient had a left leg venous reflux exam today which I reviewed and interpreted. There was no DVT. She did have some deep vein reflux in the left popliteal vein. The greater saphenous vein had no reflux.   ASSESSMENT:  Chronic left leg swelling with no significant superficial venous reflux. She does have mild deep vein reflux. 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  PLAN:  Left lower extremity venogram possible angioplasty and stenting of her left iliac vein if a finding is noted. Risks benefits possible, locations and procedure  details were explained the patient today. Risks include but are not limited to bleeding and infection or contrast reaction. Her venogram scheduled for 09/03/2014.  Fabienne Brunsharles Fields, MD [facility] [place] Office: [phone] Pager: [phone]

## 2014-08-25 NOTE — Progress Notes (Signed)
Site area: rt groin Site Prior to Removal:  Level  0 Pressure Applied For: 15 minutes Manual:   yes Patient Status During Pull:  stable Post Pull Site:  Level  0 Post Pull Instructions Given:  yes Post Pull Pulses Present: yes Dressing Applied:  tegaderm  Comments: no complications

## 2014-08-25 NOTE — Op Note (Signed)
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 The patient was placed in supine position on the operating room table. After adequate sedation, both groins were prepped and draped in usual sterile fashion. Ultrasound was used to identify the right common femoral artery. The right common femoral vein was not clearly identified although there was a small lumen present. Local anesthesia was infiltrated over the right common femoral vein. Several passes were made with the introducer needle to try to cannulate the right common femoral vein. The artery was inadvertently cannulated twice. The needle was removed and hemostasis obtained with direct pressure. At this point attention was turned to the right groin. Local anesthesia was insured over the right common femoral vein. This was easily visualized under ultrasound. Initiation was used to cannulate the right common femoral vein and an 035 Bentson were threaded up into the inferior vena cava under fluoroscopic guidance. Next a 5 French sheath was placed over the guidewire the right common femoral vein. This was thoroughly flushed with heparin saline. A 5 French crossover catheter was advanced over the guidewire and this was used to selectively catheterize the left common iliac vein. I then advanced the Bentson wire down into the left common iliac vein and the guidewire tract into the left internal iliac vein. I tried to advance Crosser catheter over this but this would not advance easily. Therefore the Bentson wire was exchanged for an [employer]035  Glidewire. I was then able to advance the Glidewire deep into the right internal iliac vein. A 5 French straight catheter was then placed over the Glidewire into the proximal left common iliac vein. A contrast angiogram was performed. This showed patency of the left common iliac vein. The left intraocular lens ring was also patent. There was severe narrowing of the left external iliac vein at its origin. Using this picture as a roadmap eye was able to direct the 35 angled Glidewire into the left external iliac vein and then down to the level of the left common femoral vein. A 5 French straight catheter was then placed over this. The Glidewire was then exchanged for an 035 Amplatz wire. A 5 French sheath was exchanged over a wire for a 8 [place] Ansell sheath. This was then advanced up and over the caval bifurcation and he Volcano IVUS device was advanced up and over the caval bifurcation into the distal left external iliac vein at the level of the common femoral vein. Measurements of the left iliac venous system were then made using IVUS.  There were 2 distinct areas of narrowing one at the common femoral external iliac vein junction and another one at the takeoff of the external and internal iliac veins. Both of these stenoses were greater than 60%.  At this point ultrasound was then used again to inspect the left groin. I was able to find a more suitable vein window well below the inguinal crease in the common femoral vein which was a wider diameter at this location. Local anesthesia was infiltrated over this. An introducing needle was then used to cannulate the left common femoral vein under ultrasound guidance. An  035 Bentson wire was then advanced up into the left iliac system. There was resistance of the wire at the level of the more distal stenosis. A 10 French sheath was placed over the guidewire and the left common femoral vein. With this extra sheath support was able to advance the guidewire up into the  inferior vena cava. Multiple contrast angiograms as well as repeat iris was performed to determine again the precise location of the 2 stenoses. The patient was given 10,000 units of intravenous heparin. An 18 x 60 Wallstent was selected and this was advanced using roadmapping techniques to the level of stenosis. This was then deployed. Completion angiogram showed that we had missed a portion of the stenosis at the iliac bifurcation. Therefore an additional 18 x 90 mm Wallstent was brought up on the operative field and this was advanced into the common iliac vein to make sure that we had completely covered the area of stenosis and the stent extended down to but not including the left common femoral vein. The stent was then postdilated with a 16 x 40 Atlas balloon with 2 overlapping inflations. Repeat iris showed 0% residual stenosis in the left external iliac vein at this point. Completion angiogram showed similar findings. Patient was given 50 mg protamine. All guidewires and delivery systems were removed but both femoral sheaths left in place to be pulled in the holding area after the ACT is less than 175.  The patient tolerated the procedure well and there were no complications. The patient was taken to the holding area in stable condition.  The patient will be placed on anticoagulation in the form of Lovenox and Xarelto for 3 months and will need a follow-up venogram in 6 months.  Fabienne Bruns, MD [facility] [place] Office: [phone] Pager: [phone]

## 2014-08-25 NOTE — Discharge Instructions (Signed)

## 2014-08-25 NOTE — Progress Notes (Signed)
Site area: lt groin Site Prior to Removal:  Level  0 Pressure Applied For: 20 minutes  Manual:   yes Patient Status During Pull:  stable Post Pull Site:  Level 0 Post Pull Instructions Given:  yes Post Pull Pulses Present: yes Dressing Applied:  tegaderm Bedrest begins @ 1515 Comments: no complications

## 2014-08-25 NOTE — Progress Notes (Signed)
Pain much less. Resting quietly.

## 2014-08-25 NOTE — Progress Notes (Signed)
Sequential LE stockings arrived, placed on both legs and machine turned on. Eating Malaw[place]urkey sandwich. Talking with [facility] [address] and Joyce GrossKay, Charity fundraiserN at Dr. Evelina DunField's office, regarding prescription coverage through patient's health insurance.

## 2014-08-25 NOTE — Interval H&P Note (Signed)
History and Physical Interval Note:  08/25/2014 8:01 AM  Donna Murray  has presented today for surgery, with the diagnosis of Left lower extremity swelling M79.89  The various methods of treatment have been discussed with the patient and family. After consideration of risks, benefits and other options for treatment, the patient has consented to  Procedure(s): LOWER EXTREMITY VENOGRAM (Left) PERCUTANEOUS TRANSLUMINAL ANGIOPLASTY/STENT ILIAC VEIN (Left) as a surgical intervention .  The patient's history has been reviewed, patient examined, no change in status, stable for surgery.  I have reviewed the patient's chart and labs.  Questions were answered to the patient's satisfaction.     [name]

## 2014-08-26 ENCOUNTER — Telehealth: Payer: Self-pay | Admitting: *Deleted

## 2014-08-26 ENCOUNTER — Telehealth: Payer: Self-pay | Admitting: Vascular Surgery

## 2014-08-26 ENCOUNTER — Encounter (HOSPITAL_COMMUNITY): Payer: Self-pay | Admitting: Vascular Surgery

## 2014-08-26 NOTE — Telephone Encounter (Addendum)
-----   Message from Sharee PimpleMarilyn K McChesney, RN sent at 08/25/2014  5:33 PM EST ----- Regarding: Schedule   ----- Message -----    From: Sherren Kernsharles E Fields, MD    Sent: 08/25/2014   2:26 PM      To: [facility] Charge Pool  Left external iliac vein stent Bilateral femoral vein puncture US both groins IVUS  She needs appt with me in 3 months with a venous duplex of the iliac venous stent at that point  Fabienne Brunsharles Fields  notified patient of post op appt. on 11-25-14 at 3pm for vascular lab and then 4pm to see dr. Darrick Pennafields

## 2014-08-26 NOTE — Telephone Encounter (Signed)
Xarelto 20 mg approved per Donna Murray at [facility] [phone]( [phone])   Donna Murray [facility] [phone]; per Donna BonineConnor the NDC# needs to be changed to NDC# [id number] for this Xarelto 20mg  Rx of 1 daily x 90 days. Pharmacist resubmitted the RX and it went through the approval process with the updated NDC#.  [name] has picked up her Lovenox for the 3 day bridge and will begin Xarelto after that.   I tried calling the patient by her home phone just rings and her cell phone's mailbox is full. Will try other contact numbers if available.   Spoke to her friend listed as a contact ( Adele), she will tell [name] to go to pharmacy to pickup her Rx of Xarelto 20 mg. She voiced understanding.

## 2014-09-03 ENCOUNTER — Encounter (HOSPITAL_COMMUNITY): Payer: Self-pay

## 2014-09-03 ENCOUNTER — Ambulatory Visit (HOSPITAL_COMMUNITY): Admit: 2014-09-03 | Payer: Self-pay | Admitting: Vascular Surgery

## 2014-09-03 SURGERY — VENOGRAM
Anesthesia: LOCAL | Laterality: Left

## 2014-11-22 ENCOUNTER — Encounter: Payer: Self-pay | Admitting: Vascular Surgery

## 2014-11-25 ENCOUNTER — Ambulatory Visit (HOSPITAL_COMMUNITY)
Admission: RE | Admit: 2014-11-25 | Discharge: 2014-11-25 | Disposition: A | Discharge status: Home / Self Care | Payer: 59 | Source: Ambulatory Visit | Attending: Vascular Surgery | Admitting: Vascular Surgery

## 2014-11-25 ENCOUNTER — Encounter: Payer: 59 | Admitting: Vascular Surgery

## 2014-11-25 DIAGNOSIS — I871 Compression of vein: Secondary | ICD-10-CM

## 2014-11-25 DIAGNOSIS — Z48812 Encounter for surgical aftercare following surgery on the circulatory system: Secondary | ICD-10-CM

## 2014-11-25 MED ORDER — RIVAROXABAN 20 MG PO TABS: 20.0000 mg | ORAL_TABLET | Freq: Every day | ORAL | Status: DC

## 2014-12-06 ENCOUNTER — Ambulatory Visit: Payer: 59 | Admitting: Nurse Practitioner

## 2014-12-09 ENCOUNTER — Ambulatory Visit: Payer: 59 | Admitting: Nurse Practitioner

## 2014-12-10 ENCOUNTER — Encounter: Payer: Self-pay | Admitting: Nurse Practitioner

## 2014-12-15 ENCOUNTER — Encounter: Payer: Self-pay | Admitting: Family

## 2014-12-16 ENCOUNTER — Ambulatory Visit (HOSPITAL_COMMUNITY)
Admission: RE | Admit: 2014-12-16 | Discharge: 2014-12-16 | Disposition: A | Discharge status: Home / Self Care | Payer: 59 | Source: Ambulatory Visit | Attending: Vascular Surgery | Admitting: Vascular Surgery

## 2014-12-16 ENCOUNTER — Other Ambulatory Visit: Payer: Self-pay | Admitting: Vascular Surgery

## 2014-12-16 ENCOUNTER — Encounter: Payer: Self-pay | Admitting: Vascular Surgery

## 2014-12-16 ENCOUNTER — Ambulatory Visit (INDEPENDENT_AMBULATORY_CARE_PROVIDER_SITE_OTHER): Payer: 59 | Admitting: Vascular Surgery

## 2014-12-16 VITALS — BP 134/93 | HR 88 | Ht 65.0 in | Wt 228.6 lb

## 2014-12-16 DIAGNOSIS — I82422 Acute embolism and thrombosis of left iliac vein: Secondary | ICD-10-CM | POA: Diagnosis not present

## 2014-12-16 DIAGNOSIS — I871 Compression of vein: Secondary | ICD-10-CM

## 2014-12-16 DIAGNOSIS — Z48812 Encounter for surgical aftercare following surgery on the circulatory system: Secondary | ICD-10-CM | POA: Diagnosis not present

## 2014-12-16 MED ORDER — RIVAROXABAN 20 MG PO TABS: 20.0000 mg | ORAL_TABLET | Freq: Every day | ORAL | Status: DC

## 2014-12-16 NOTE — Addendum Note (Signed)
Addended by: Adria DillELDRIDGE-LEWIS, Maite Burlison L on: 12/16/2014 02:46 PM   Modules accepted: Orders

## 2014-12-16 NOTE — Progress Notes (Signed)
Patient is status post left iliac vein stenting on 08/25/2014. She states she still has some swelling in her left leg. However she states that achiness and pain that she had in her left leg is somewhat improved. She is currently on Xarelto daily. She denies any increased swelling in the leg. She has no problems in the right leg.  Past Medical History  Diagnosis Date  . Asthma   . Headache   . Chronic migraine 08/05/2014  . Chronic insomnia   . B12 deficiency   . GERD (gastroesophageal reflux disease)   . Bell's palsy     right  . History of stroke     right hemisensory deficit  . Complication of anesthesia     Woke up during Hysterectomy  . Stroke 1989    "Bells palsy" shows up when very tired- right sie of mouth  . Pneumonia 2013    Past Surgical History  Procedure Laterality Date  . Partial hysterectomy    . Abdominal hysterectomy    . Thyroidectomy, partial    . Venoplasty Left 08/25/2014    Procedure: LOWER EXTREMITY VENOGRAM;  Surgeon: Sherren Kerns, MD;  Location: [facility] OR;  Service: Vascular;  Laterality: Left;  . Angioplasty illiac artery Left 08/25/2014    Procedure: PERCUTANEOUS TRANSLUMINAL ANGIOPLASTY/STENT ILIAC VEIN;  Surgeon: Sherren Kerns, MD;  Location: [facility] OR;  Service: Vascular;  Laterality: Left;    Current Outpatient Prescriptions on File Prior to Visit  Medication Sig Dispense Refill  . albuterol (PROVENTIL HFA;VENTOLIN HFA) 108 (90 BASE) MCG/ACT inhaler Inhale 1-2 puffs into the lungs every 6 (six) hours as needed for wheezing or shortness of breath. 1 Inhaler 0  . furosemide (LASIX) 20 MG tablet Take 20 mg by mouth 2 (two) times daily.   3  . loratadine (CLARITIN) 10 MG tablet Take 10 mg by mouth daily as needed for allergies.     . montelukast (SINGULAIR) 10 MG tablet Take 10 mg by mouth at bedtime as needed (allergies).     . nortriptyline (PAMELOR) 10 MG capsule Take one capsule at night for one week, then take 2 capsules at night for one week, then take  3 capsules at night (Patient taking differently: as needed. Take one capsule at night for one week, then take 2 capsules at night for one week, then take 3 capsules at night) 90 capsule 3  . omeprazole (PRILOSEC) 40 MG capsule Take 40 mg by mouth daily as needed (heartburn).   0  . potassium chloride (MICRO-K) 10 MEQ CR capsule Take 10 mEq by mouth daily.  3  . SUMAtriptan (IMITREX) 100 MG tablet Take 1 tablet (100 mg total) by mouth once as needed for migraine or headache. (Patient taking differently: Take 100 mg by mouth every 2 (two) hours as needed for migraine or headache. ) 10 tablet 2  . ketoconazole (NIZORAL) 2 % shampoo Apply 1 application topically 2 (two) times a week.    . mometasone (NASONEX) 50 MCG/ACT nasal spray Place 2 sprays into the nose daily as needed (allergies).      No current facility-administered medications on file prior to visit.    No Known Allergies  Review of systems: She denies chest pain. She denies shortness of breath.  Physical exam:  Filed Vitals:   12/16/14 1122  BP: 134/93  Pulse: 88  Height:  (1.651 m)  Weight: 228 lb 9.6 oz (103.692 kg)  SpO2: 99%    Left lower extremity: 2+ left  dorsalis pedis pulse left leg circumference approximately 15% larger than the right leg, no hematoma and groin  Abdomen: Soft nontender nondistended no mass  Data: Patient had a duplex ultrasound of her iliac stent today. This was widely patent with no areas of narrowing.  Assessment: Patent left iliac stent with some improvement of pain symptoms overall but persistent left leg swelling.  Plan: Continue Xarelto for 6 more months. We'll consider discontinuing it at that point. Patient will also have a iliac vein duplex at her next office visit. She will call if she has any acute onset of worsening swelling. She will walk as much as possible she has no activity restrictions.  Fabienne Brunsharles Fields, MD [facility] [place] Office:  [phone] Pager: [phone]

## 2015-04-18 ENCOUNTER — Other Ambulatory Visit: Payer: Self-pay | Admitting: Vascular Surgery

## 2015-06-23 ENCOUNTER — Encounter (HOSPITAL_COMMUNITY): Payer: 59

## 2015-06-23 ENCOUNTER — Ambulatory Visit: Payer: 59 | Admitting: Vascular Surgery

## 2015-07-12 ENCOUNTER — Encounter: Payer: Self-pay | Admitting: Vascular Surgery

## 2015-07-21 ENCOUNTER — Ambulatory Visit (HOSPITAL_COMMUNITY)
Admission: RE | Admit: 2015-07-21 | Discharge: 2015-07-21 | Disposition: A | Discharge status: Home / Self Care | Payer: BLUE CROSS/BLUE SHIELD | Source: Ambulatory Visit | Attending: Vascular Surgery | Admitting: Vascular Surgery

## 2015-07-21 ENCOUNTER — Ambulatory Visit (INDEPENDENT_AMBULATORY_CARE_PROVIDER_SITE_OTHER): Payer: BLUE CROSS/BLUE SHIELD | Admitting: Vascular Surgery

## 2015-07-21 ENCOUNTER — Encounter: Payer: Self-pay | Admitting: Vascular Surgery

## 2015-07-21 VITALS — BP 120/73 | HR 81 | Temp 97.3°F | Resp 18 | Ht 65.0 in | Wt 228.5 lb

## 2015-07-21 DIAGNOSIS — I82422 Acute embolism and thrombosis of left iliac vein: Secondary | ICD-10-CM | POA: Diagnosis not present

## 2015-07-21 DIAGNOSIS — M79605 Pain in left leg: Secondary | ICD-10-CM | POA: Diagnosis present

## 2015-07-21 DIAGNOSIS — M7989 Other specified soft tissue disorders: Secondary | ICD-10-CM | POA: Insufficient documentation

## 2015-07-21 NOTE — Progress Notes (Signed)
Patient is status post left iliac vein stenting on 08/25/2014. She states she still has some swelling in her left leg. However she states that achiness and pain that she had in her left leg is somewhat improved. She is currently on Xarelto daily. She did stop this for a few days recently after having some bright red blood per rectum. She denies any increased swelling in the leg. She has no problems in the right leg.    Past Medical History   Diagnosis  Date   .  Asthma     .  Headache     .  Chronic migraine  08/05/2014   .  Chronic insomnia     .  B12 deficiency     .  GERD (gastroesophageal reflux disease)     .  Bell's palsy         right   .  History of stroke         right hemisensory deficit   .  Complication of anesthesia         Woke up during Hysterectomy   .  Stroke  1989       "Bells palsy" shows up when very tired- right sie of mouth   .  Pneumonia  2013       Past Surgical History   Procedure  Laterality  Date   .  Partial hysterectomy       .  Abdominal hysterectomy       .  Thyroidectomy, partial       .  Venoplasty  Left  08/25/2014       Procedure: LOWER EXTREMITY VENOGRAM;  Surgeon: Sherren Kerns, MD;  Location: [facility] OR;  Service: Vascular;  Laterality: Left;   .  Angioplasty illiac artery  Left  08/25/2014       Procedure: PERCUTANEOUS TRANSLUMINAL ANGIOPLASTY/STENT ILIAC VEIN;  Surgeon: Sherren Kerns, MD;  Location: [facility] OR;  Service: Vascular;  Laterality: Left;       Current Outpatient Prescriptions on File Prior to Visit  Medication Sig Dispense Refill  . albuterol (PROVENTIL HFA;VENTOLIN HFA) 108 (90 BASE) MCG/ACT inhaler Inhale 1-2 puffs into the lungs every 6 (six) hours as needed for wheezing or shortness of breath. 1 Inhaler 0  . furosemide (LASIX) 20 MG tablet Take 20 mg by mouth 2 (two) times daily.   3  . ketoconazole (NIZORAL) 2 % shampoo Apply 1 application topically 2 (two) times a week.    . loratadine (CLARITIN) 10 MG tablet Take 10 mg by mouth  daily as needed for allergies.     . mometasone (NASONEX) 50 MCG/ACT nasal spray Place 2 sprays into the nose daily as needed (allergies).     . montelukast (SINGULAIR) 10 MG tablet Take 10 mg by mouth at bedtime as needed (allergies).     . nortriptyline (PAMELOR) 10 MG capsule Take one capsule at night for one week, then take 2 capsules at night for one week, then take 3 capsules at night (Patient taking differently: as needed. Take one capsule at night for one week, then take 2 capsules at night for one week, then take 3 capsules at night) 90 capsule 3  . omeprazole (PRILOSEC) 40 MG capsule Take 40 mg by mouth daily as needed (heartburn).   0  . potassium chloride (MICRO-K) 10 MEQ CR capsule Take 10 mEq by mouth daily.  3  . rivaroxaban (XARELTO) 20 MG TABS tablet Take 1 tablet (20 mg total)  by mouth daily with supper. 90 tablet 2  . SUMAtriptan (IMITREX) 100 MG tablet Take 1 tablet (100 mg total) by mouth once as needed for migraine or headache. (Patient taking differently: Take 100 mg by mouth every 2 (two) hours as needed for migraine or headache. ) 10 tablet 2   No current facility-administered medications on file prior to visit.    No Known Allergies  Review of systems: She denies chest pain. She denies shortness of breath.  Physical exam:    Filed Vitals:   07/21/15 1557  BP: 120/73  Pulse: 81  Temp: 97.3 F (36.3 C)  TempSrc: Oral  Resp: 18  Height:  (1.651 m)  Weight: 228 lb 8 oz (103.647 kg)  SpO2: 97%    Left lower extremity: 2+ left dorsalis pedis pulse left leg circumference approximately 15% larger than the right leg, no hematoma and groin  Abdomen: Soft nontender nondistended no mass  Data: Patient had a duplex ultrasound of her iliac stent today. Stent was not well visualized due to overlying bowel gas. However she had good waveforms in the distal external iliac and common femoral veins suggesting patency. She has chronic deep vein reflux as  well.  Assessment: Patent left iliac stent with some improvement of pain symptoms overall but persistent left leg swelling most likely secondary to deep vein reflux.  Plan: Continue Xarelto as long as she is not having complications from this. Patient will also have a iliac vein duplex at her next office visit. She will call if she has any acute onset of worsening swelling. She will walk as much as possible she has no activity restrictions.  Since she has had some bright red blood per rectum, we will schedule her for evaluation by gastroenterology for possible colonoscopy. She would be able to stop her Xarelto for the colonoscopy if necessary.  Also discussed with the patient today weight loss options. We went over her diet as well as exercise program and possible strategies for losing weight in the future which should help her leg swelling. She will also do continue to wear compression stockings as much as possible.  Fabienne Bruns, MD [facility] [place] Office: [phone] Pager: [phone]

## 2015-07-22 NOTE — Addendum Note (Signed)
Addended by: Adria Dill L on: 07/22/2015 10:19 AM   Modules accepted: Orders

## 2015-07-22 NOTE — Addendum Note (Signed)
Addended by: Adria Dill L on: 07/22/2015 11:06 AM   Modules accepted: Orders

## 2015-08-04 ENCOUNTER — Other Ambulatory Visit: Payer: Self-pay | Admitting: Family Medicine

## 2015-08-04 DIAGNOSIS — Z1231 Encounter for screening mammogram for malignant neoplasm of breast: Secondary | ICD-10-CM

## 2015-10-27 NOTE — Progress Notes (Signed)
This encounter was created in error - please disregard.

## 2016-01-31 ENCOUNTER — Encounter: Payer: Self-pay | Admitting: Vascular Surgery

## 2016-02-02 ENCOUNTER — Ambulatory Visit (HOSPITAL_COMMUNITY)
Admission: RE | Admit: 2016-02-02 | Discharge: 2016-02-02 | Disposition: A | Discharge status: Home / Self Care | Payer: BLUE CROSS/BLUE SHIELD | Source: Ambulatory Visit | Attending: Vascular Surgery | Admitting: Vascular Surgery

## 2016-02-02 ENCOUNTER — Ambulatory Visit (INDEPENDENT_AMBULATORY_CARE_PROVIDER_SITE_OTHER)
Admission: RE | Admit: 2016-02-02 | Discharge: 2016-02-02 | Disposition: A | Discharge status: Home / Self Care | Payer: BLUE CROSS/BLUE SHIELD | Source: Ambulatory Visit | Attending: Vascular Surgery | Admitting: Vascular Surgery

## 2016-02-02 ENCOUNTER — Encounter: Payer: Self-pay | Admitting: Vascular Surgery

## 2016-02-02 ENCOUNTER — Ambulatory Visit (INDEPENDENT_AMBULATORY_CARE_PROVIDER_SITE_OTHER): Payer: BLUE CROSS/BLUE SHIELD | Admitting: Vascular Surgery

## 2016-02-02 VITALS — BP 138/90 | HR 75 | Ht 65.0 in | Wt 239.3 lb

## 2016-02-02 DIAGNOSIS — Z95828 Presence of other vascular implants and grafts: Secondary | ICD-10-CM | POA: Diagnosis not present

## 2016-02-02 DIAGNOSIS — M79601 Pain in right arm: Secondary | ICD-10-CM

## 2016-02-02 DIAGNOSIS — M7989 Other specified soft tissue disorders: Secondary | ICD-10-CM

## 2016-02-02 DIAGNOSIS — M79604 Pain in right leg: Secondary | ICD-10-CM

## 2016-02-02 DIAGNOSIS — I871 Compression of vein: Secondary | ICD-10-CM | POA: Diagnosis not present

## 2016-02-02 NOTE — Addendum Note (Signed)
Addended by: Dannielle KarvonenMANESS-HARRISON, CHANDA C on: 02/02/2016 03:42 PM   Modules accepted: Orders

## 2016-02-02 NOTE — Progress Notes (Signed)
Patient is status post left iliac vein stenting on 08/25/2014. She states she still has some swelling in her left leg. However she states that achiness and pain that she had in her left leg is somewhat improved. She is currently on Xarelto daily. She denies any increased swelling in the leg. Today she also complains of pain in the right leg which extends from the hip all the way down into the foot. She states this is been going on for about for 5 days. She denies any trauma to the extremity. She denies any prior history of DVT on the right side. She states the pain is worse when she walks and in the evenings.        Past Medical History   Diagnosis  Date   .  Asthma     .  Headache     .  Chronic migraine  08/05/2014   .  Chronic insomnia     .  B12 deficiency     .  GERD (gastroesophageal reflux disease)     .  Bell's palsy         right   .  History of stroke         right hemisensory deficit   .  Complication of anesthesia         Woke up during Hysterectomy   .  Stroke  1989       "Bells palsy" shows up when very tired- right sie of mouth   .  Pneumonia  2013             Past Surgical History   Procedure  Laterality  Date   .  Partial hysterectomy       .  Abdominal hysterectomy       .  Thyroidectomy, partial       .  Venoplasty  Left  08/25/2014       Procedure: LOWER EXTREMITY VENOGRAM;  Surgeon: Sherren Kernsharles E Kathlyne Loud, MD;  [facility];  Service: Vascular;  Laterality: Left;   .  Angioplasty illiac artery  Left  08/25/2014       Procedure: PERCUTANEOUS TRANSLUMINAL ANGIOPLASTY/STENT ILIAC VEIN;  Surgeon: Sherren Kernsharles E Dietrick Barris, MD;  [facility];  Service: Vascular;  Laterality: Left;       Current Outpatient Prescriptions on File Prior to Visit  Medication Sig Dispense Refill  . albuterol (PROVENTIL HFA;VENTOLIN HFA) 108 (90 BASE) MCG/ACT inhaler Inhale 1-2 puffs into the lungs every 6 (six) hours as needed for wheezing or shortness of breath. 1 Inhaler 0  . furosemide (LASIX) 20  MG tablet Take 20 mg by mouth 2 (two) times daily.   3  . ketoconazole (NIZORAL) 2 % shampoo Apply 1 application topically 2 (two) times a week.    . loratadine (CLARITIN) 10 MG tablet Take 10 mg by mouth daily as needed for allergies.     . mometasone (NASONEX) 50 MCG/ACT nasal spray Place 2 sprays into the nose daily as needed (allergies).     . montelukast (SINGULAIR) 10 MG tablet Take 10 mg by mouth at bedtime as needed (allergies).     Marland Kitchen. omeprazole (PRILOSEC) 40 MG capsule Take 40 mg by mouth daily as needed (heartburn).   0  . potassium chloride (MICRO-K) 10 MEQ CR capsule Take 10 mEq by mouth daily.  3  . rivaroxaban (XARELTO) 20 MG TABS tablet Take 1 tablet (20 mg total) by mouth daily with supper. 90 tablet 2  . SUMAtriptan (  IMITREX) 100 MG tablet Take 1 tablet (100 mg total) by mouth once as needed for migraine or headache. (Patient taking differently: Take 100 mg by mouth every 2 (two) hours as needed for migraine or headache. ) 10 tablet 2  . nortriptyline (PAMELOR) 10 MG capsule Take one capsule at night for one week, then take 2 capsules at night for one week, then take 3 capsules at night (Patient not taking: Reported on 02/02/2016) 90 capsule 3   No current facility-administered medications on file prior to visit.      No Known Allergies   Review of systems: She denies chest pain. She denies shortness of breath.   Physical exam:    Vitals:   02/02/16 0943  BP: 138/90  Pulse: 75  SpO2: 100%  Weight: 239 lb 4.8 oz (108.5 kg)  Height: 5\' 5"  (1.651 m)     Left lower extremity: 2+ left dorsalis pedis pulse left leg circumference approximately 15% larger than the right leg, no hematoma and groin Right lower extremity: 2+ dorsalis pedis posterior tibial pulse no significant edema pain on palpation posterior right calf and anterior compartment no mass   Abdomen: Soft nontender nondistended no mass   Data: Patient had a duplex ultrasound of her iliac stent today. Stent was  not well visualized due to overlying bowel gas. However she had good waveforms in the distal external iliac and common femoral veins suggesting patency. She has chronic deep vein reflux as well. This is unchanged from her previous scan.  In light of the patient's pain symptoms in her right lower extremity with also did a duplex of her right lower extremity rule out DVT in the showed no evidence of DVT.    Assessment: Patent left iliac stent with some improvement of pain symptoms overall but persistent left leg swelling most likely secondary to deep vein reflux. Pain in right leg does not seem to be of vascular origin   Plan: Continue Xarelto as long as she is not having complications from this. Patient will also have a iliac vein duplex at her next office visit. She will call if she has any acute onset of worsening swelling. She will walk as much as possible she has no activity restrictions.   She will also do continue to wear compression stockings as much as possible.  She will follow-up in one year with repeat duplex of her left iliac vein stent.   Fabienne Brunsharles Benoit Meech, MD [facility] [place] Office: [phone] Pager: [phone]

## 2016-05-31 ENCOUNTER — Ambulatory Visit: Payer: Self-pay | Admitting: Allergy

## 2016-05-31 ENCOUNTER — Encounter (INDEPENDENT_AMBULATORY_CARE_PROVIDER_SITE_OTHER): Payer: Self-pay

## 2016-05-31 ENCOUNTER — Encounter: Payer: Self-pay | Admitting: Allergy

## 2016-05-31 ENCOUNTER — Ambulatory Visit (INDEPENDENT_AMBULATORY_CARE_PROVIDER_SITE_OTHER): Payer: BLUE CROSS/BLUE SHIELD | Admitting: Allergy

## 2016-05-31 ENCOUNTER — Ambulatory Visit: Payer: Self-pay | Admitting: Allergy & Immunology

## 2016-05-31 VITALS — BP 136/88 | Temp 98.3°F | Ht 65.0 in | Wt 237.5 lb

## 2016-05-31 DIAGNOSIS — T781XXA Other adverse food reactions, not elsewhere classified, initial encounter: Secondary | ICD-10-CM | POA: Diagnosis not present

## 2016-05-31 DIAGNOSIS — H1010 Acute atopic conjunctivitis, unspecified eye: Secondary | ICD-10-CM

## 2016-05-31 DIAGNOSIS — J309 Allergic rhinitis, unspecified: Secondary | ICD-10-CM | POA: Diagnosis not present

## 2016-05-31 DIAGNOSIS — J4540 Moderate persistent asthma, uncomplicated: Secondary | ICD-10-CM | POA: Diagnosis not present

## 2016-05-31 MED ORDER — CETIRIZINE HCL 10 MG PO TABS: 10.0000 mg | ORAL_TABLET | Freq: Every day | ORAL | 5 refills | Status: AC

## 2016-05-31 MED ORDER — OLOPATADINE HCL 0.7 % OP SOLN: 1.0000 [drp] | Freq: Every day | OPHTHALMIC | 5 refills | Status: DC | PRN

## 2016-05-31 MED ORDER — FLUTICASONE PROPIONATE 50 MCG/ACT NA SUSP
2.0000 | Freq: Every day | NASAL | 5 refills | Status: AC
Start: 2016-05-31 — End: ?

## 2016-05-31 MED ORDER — BUDESONIDE-FORMOTEROL FUMARATE 160-4.5 MCG/ACT IN AERO: 2.0000 | INHALATION_SPRAY | Freq: Two times a day (BID) | RESPIRATORY_TRACT | 5 refills | Status: AC

## 2016-05-31 MED ORDER — ALBUTEROL SULFATE HFA 108 (90 BASE) MCG/ACT IN AERS: 2.0000 | INHALATION_SPRAY | Freq: Four times a day (QID) | RESPIRATORY_TRACT | 0 refills | Status: DC | PRN

## 2016-05-31 MED ORDER — MONTELUKAST SODIUM 10 MG PO TABS: 10.0000 mg | ORAL_TABLET | Freq: Every evening | ORAL | 5 refills | Status: AC | PRN

## 2016-05-31 NOTE — Patient Instructions (Signed)
Take the following medications:   Zyrtec 10mg  daily  Singulair 10mg  daily  Flonase 2 sprays each nostril daily.  Demonstrated proper nasal spray technique  Pazeo 1 drop each eye as needed for itchy, watery, red eyes   Symbicort 160mcg 2 sprays twice a day with spacer    Albuterol inhaler 2 puffs or nebulizer 1 vial as needed for cough, wheeze, chest tightness, shortness of breath  Continue avoidance of stove-top egg.  Will plan to skin test to egg at follow-up visit.   Recommend allergy testing to help guide avoidance measures and treatment plan.   Follow-up 2-3 months or sooner for skin testing

## 2016-05-31 NOTE — Progress Notes (Signed)
New Patient Note  RE: Donna Murray MRN: [id number] DOB: Jun 14, 1966 Date of Office Visit: 05/31/2016  Referring provider:   Primary care provider: Cain Saupe, MD  Chief Complaint: Transfer of allergy care  History of present illness: Donna Murray is a 50 y.o. female presenting today for evaluation of asthma and allergies. She was previously seeing her primary care physician for her asthma and allergy issues.  However her PCP left recently and she no longer has any of her regular medications or refills. 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   She states she knows she is allergic to grass and weed pollens, dust and dust mites from testing she had as a child and did do a course of allergy shots.   She reports symptoms of nasal congestion and drainage, wakes up with sensation of "mucus ball" in the back of her throat, sinus headaches,watery eyes . She also has been having a cough for the past 3 months. She was using Zyrtec, singulair (which she ran out of), flonase 2 sprays each nostril daily.   She did have a sinus infection about 3 or so months ago and completed an antibiotic as well as a 2 week prednisone taper.  In regards to her asthma she used to use Advair last year however she ran out several months ago. Since she has been using albuterol nebulizer at least twice a day. She is also waking up from sleep with coughing. She does endorse some chest tightness on occasion. She has sleep apnea and does wear a CPAP machine with a chinstrap.     She reports she avoids scrambled eggs as she reports she will have nausea and vomiting that started about 19 years ago thus she avoids.  She able to eat baked egg products.    Review of systems: Review of Systems  Constitutional: Negative for chills, fever and malaise/fatigue.  HENT: Positive for congestion. Negative for sinus pain and sore throat.   Eyes: Negative for redness.  Respiratory:  Positive for cough, shortness of breath and wheezing.   Cardiovascular: Negative for chest pain.  Gastrointestinal: Negative for heartburn, nausea and vomiting.  Skin: Negative for itching and rash.    All other systems negative unless noted above in HPI  Past medical history: Past Medical History:  Diagnosis Date  . Asthma   . B12 deficiency   . Bell's palsy    right  . Chronic insomnia   . Chronic migraine 08/05/2014  . Complication of anesthesia    Woke up during Hysterectomy  . GERD (gastroesophageal reflux disease)   . Headache   . History of stroke    right hemisensory deficit  . Obstructive sleep apnea   . Pneumonia 2013  . Stroke [facility]) 1989   "Bells palsy" shows up when very tired- right sie of mouth   Past surgical history: Past Surgical History:  Procedure Laterality Date  . ABDOMINAL HYSTERECTOMY    . ANGIOPLASTY ILLIAC ARTERY Left 08/25/2014   Procedure: PERCUTANEOUS TRANSLUMINAL ANGIOPLASTY/STENT ILIAC VEIN;  Surgeon: Sherren Kerns, MD;  Location: [facility] OR;  Service: Vascular;  Laterality: Left;  . PARTIAL HYSTERECTOMY    . THYROIDECTOMY, PARTIAL    . VENOPLASTY Left 08/25/2014   Procedure: LOWER EXTREMITY VENOGRAM;  Surgeon: Sherren Kerns, MD;  Location: [facility] OR;  Service: Vascular;  Laterality: Left;    Family history:  Family History  Problem Relation Age of Onset  . Brain cancer  Mother   . Allergic rhinitis Mother   . Asthma Mother   . Hypertension Father   . Diabetes Father   . Heart disease Father   . Asthma Father   . Migraines Sister   . Brain cancer Maternal Aunt   . Brain cancer Maternal Aunt   . Migraines Sister     Social history: She lives in a trailer with carpeting with electric heating and central cooling. There is a dog in the home; there are cats and dogs outside the home. She believes that may be water damage or mildew in the home. There is no roach concern in the home. She works as a Engineer, watercleaner.    Medication List:   Medication  List       Accurate as of 05/31/16  1:44 PM. Always use your most recent med list.          albuterol 108 (90 Base) MCG/ACT inhaler Commonly known as:  PROVENTIL HFA;VENTOLIN HFA Inhale 1-2 puffs into the lungs every 6 (six) hours as needed for wheezing or shortness of breath.   furosemide 20 MG tablet Commonly known as:  LASIX Take 20 mg by mouth 2 (two) times daily.   ketoconazole 2 % shampoo Commonly known as:  NIZORAL Apply 1 application topically 2 (two) times a week.   loratadine 10 MG tablet Commonly known as:  CLARITIN Take 10 mg by mouth daily as needed for allergies.   mometasone 50 MCG/ACT nasal spray Commonly known as:  NASONEX Place 2 sprays into the nose daily as needed (allergies).   montelukast 10 MG tablet Commonly known as:  SINGULAIR Take 10 mg by mouth at bedtime as needed (allergies).   nortriptyline 10 MG capsule Commonly known as:  PAMELOR Take one capsule at night for one week, then take 2 capsules at night for one week, then take 3 capsules at night   omeprazole 40 MG capsule Commonly known as:  PRILOSEC Take 40 mg by mouth daily as needed (heartburn).   potassium chloride 10 MEQ CR capsule Commonly known as:  MICRO-K Take 10 mEq by mouth daily.   rivaroxaban 20 MG Tabs tablet Commonly known as:  XARELTO Take 1 tablet (20 mg total) by mouth daily with supper.   SUMAtriptan 100 MG tablet Commonly known as:  IMITREX Take 1 tablet (100 mg total) by mouth once as needed for migraine or headache.   Vitamin D (Ergocalciferol) 50000 units Caps capsule Commonly known as:  DRISDOL TK 1 C PO 2 TIMES A WK       Known medication allergies: No Known Allergies   Physical examination: Blood pressure 136/88, temperature 98.3 F (36.8 C), temperature source Oral, height 5\' 5"  (1.651 m), weight 237 lb 8 oz (107.7 kg).  General: Alert, interactive, in no acute distress. HEENT: TMs pearly gray, turbinates moderately edematous without discharge,  post-pharynx non erythematous. Neck: Supple without lymphadenopathy. Lungs: Clear to auscultation without wheezing, rhonchi or rales. {no increased work of breathing.  Coughing throughout visit CV: Normal S1, S2 without murmurs. Abdomen: Nondistended, nontender. Skin: Warm and dry, without lesions or rashes. Extremities:  No clubbing, cyanosis or edema. Neuro:   Grossly intact.  Diagnositics/Labs:  Spirometry: FEV1: 1.81L  79%, FVC: 2.29L  82%, ratio consistent with Nonobstructive pattern however FEV1 is slightly reduced.   After duoneb she had no significant change, coughing subsided  Assessment and plan:  Moderate persistent asthma - At this time not well-controlled - Symbicort 160mcg 2 sprays twice a day  with spacer   - Singulair 10mg  daily - Albuterol inhaler 2 puffs or nebulizer 1 vial as needed for cough, wheeze, chest tightness, shortness of breath Asthma control goals:   Full participation in all desired activities (may need albuterol before activity)  Albuterol use two time or less a week on average (not counting use with activity)  Cough interfering with sleep two time or less a month  Oral steroids no more than once a year  No hospitalizations  Allergic rhinoconjunctivitis  - Zyrtec 10mg  daily  - Flonase 2 sprays each nostril daily.  Demonstrated proper nasal spray technique  - Pazeo 1 drop each eye as needed for itchy, watery, red eyes  - We'll plan to skin test to environmental allergens at next visit   Possible food allergy - Continue avoidance of stove-top egg.  Will plan to skin test to egg at follow-up visit.    Follow-up 2-3 months or sooner for skin testing   I appreciate the opportunity to take part in Margert's care. Please do not hesitate to contact me with questions.  Sincerely,   Margo AyeShaylar Mindee Robledo, MD Allergy/Immunology [facility] [place]

## 2016-06-05 ENCOUNTER — Telehealth: Payer: Self-pay | Admitting: *Deleted

## 2016-06-05 MED ORDER — OLOPATADINE HCL 0.1 % OP SOLN: OPHTHALMIC | 5 refills | Status: AC

## 2016-06-05 NOTE — Telephone Encounter (Signed)
Let's do Olopatadine.  Thanks.

## 2016-06-05 NOTE — Telephone Encounter (Signed)
Dr Charna BusmanPadgett Pazeo is not preferred by pharmacy medications that are preferred are olopatadine, azelastine and ketorolac please advise.

## 2016-06-05 NOTE — Telephone Encounter (Signed)
Please advise directions so I can send it in

## 2016-06-05 NOTE — Telephone Encounter (Signed)
1 drop each daily eye as needed for itchy/watery/red eyes

## 2016-06-07 NOTE — Telephone Encounter (Signed)
Olopatadine ( Patanol)  0.1% ophthalmic solution 1 drop each eye daily as needed for itchy/watery/red eyes electronically sent to [facility] [address] on 06/05/16.

## 2016-06-28 ENCOUNTER — Other Ambulatory Visit: Payer: Self-pay | Admitting: Vascular Surgery

## 2016-06-28 DIAGNOSIS — I82422 Acute embolism and thrombosis of left iliac vein: Secondary | ICD-10-CM

## 2016-06-29 ENCOUNTER — Other Ambulatory Visit: Payer: Self-pay

## 2016-06-29 DIAGNOSIS — J4540 Moderate persistent asthma, uncomplicated: Secondary | ICD-10-CM

## 2016-06-29 MED ORDER — ALBUTEROL SULFATE HFA 108 (90 BASE) MCG/ACT IN AERS: 2.0000 | INHALATION_SPRAY | Freq: Four times a day (QID) | RESPIRATORY_TRACT | 0 refills | Status: AC | PRN

## 2016-09-06 ENCOUNTER — Telehealth: Payer: Self-pay

## 2016-09-06 DIAGNOSIS — Z86718 Personal history of other venous thrombosis and embolism: Secondary | ICD-10-CM

## 2016-09-06 DIAGNOSIS — M25552 Pain in left hip: Secondary | ICD-10-CM

## 2016-09-06 NOTE — Telephone Encounter (Signed)
Discussed pt's concerns with Dr. Darrick PennaFields.  Recommended to have pt. take her Rx of Xarelto, until it is completely gone, then start ASA 325 mg, one tab, daily.  Also advised to move pt's f/u appt. to earlier date, with Aorto/Iliac venous study, in addition to the venous reflux study, that was previously ordered.  Will have Scheduler contact pt. For appt.  Notified pt. of plan to change to ASA 325 mg., 1 tablet, daily when Xarelto is completed.  Verb. Understanding.

## 2016-09-06 NOTE — Telephone Encounter (Signed)
Spoke with pt.  Stated her insurance changed and now the out of pocket expense for Xarelto will be $1000.00.  Is asking about a change to generic anticoagulant.  Questioned about status of symptoms with left leg.  Reported she wears compression hose faithfully each day, and takes Xarelto daily.  c/o pain in left hip / left low back for approx. 2 mos. with walking.  Will discuss with Dr. Darrick PennaFields and return call.

## 2016-09-06 NOTE — Telephone Encounter (Signed)
-----   Message from Jena GaussMichele A Roczniak sent at 09/06/2016 12:32 PM EDT ----- Regarding: perscription question Pt states the blood thinner we prescribed her used to cost $32 and now costs $1,000. She was wondering if there was a generic she could be prescribed instead.   Her phone # is [phone].  Thank you.

## 2016-09-07 NOTE — Telephone Encounter (Signed)
Sched reflux 10/12/16 at 4:00 and iliac + MD 10/25/16 at 8:00 and 9:45. Ran dates by Okey Regalarol - she ok'ed them. Spoke to pt to inform them of appts.

## 2016-10-12 ENCOUNTER — Ambulatory Visit (HOSPITAL_COMMUNITY): Admission: RE | Admit: 2016-10-12 | Payer: BLUE CROSS/BLUE SHIELD | Source: Ambulatory Visit

## 2016-10-25 ENCOUNTER — Ambulatory Visit: Payer: BLUE CROSS/BLUE SHIELD | Admitting: Vascular Surgery

## 2016-10-25 ENCOUNTER — Ambulatory Visit (HOSPITAL_COMMUNITY): Payer: BLUE CROSS/BLUE SHIELD

## 2016-12-06 ENCOUNTER — Encounter: Payer: Self-pay | Admitting: Vascular Surgery

## 2016-12-20 ENCOUNTER — Encounter: Payer: Self-pay | Admitting: Vascular Surgery

## 2016-12-20 ENCOUNTER — Ambulatory Visit (INDEPENDENT_AMBULATORY_CARE_PROVIDER_SITE_OTHER): Payer: Self-pay | Admitting: Vascular Surgery

## 2016-12-20 ENCOUNTER — Encounter (HOSPITAL_COMMUNITY): Payer: Self-pay

## 2016-12-20 VITALS — BP 140/85 | HR 96 | Temp 99.3°F | Resp 18 | Ht 65.0 in | Wt 237.0 lb

## 2016-12-20 DIAGNOSIS — Z5321 Procedure and treatment not carried out due to patient leaving prior to being seen by health care provider: Secondary | ICD-10-CM

## 2016-12-20 NOTE — Progress Notes (Signed)
Pt lett prior to my seeing her due to insurance reasons.  She will reschedule.  Donna Murray

## 2017-02-07 ENCOUNTER — Encounter (HOSPITAL_COMMUNITY): Payer: Self-pay

## 2017-02-07 ENCOUNTER — Ambulatory Visit: Payer: BLUE CROSS/BLUE SHIELD | Admitting: Vascular Surgery

## 2017-02-07 ENCOUNTER — Ambulatory Visit: Payer: Self-pay | Admitting: Vascular Surgery

## 2017-02-07 ENCOUNTER — Encounter (HOSPITAL_COMMUNITY): Payer: BLUE CROSS/BLUE SHIELD

## 2017-03-14 ENCOUNTER — Encounter (HOSPITAL_COMMUNITY): Payer: Self-pay

## 2017-03-14 ENCOUNTER — Ambulatory Visit: Payer: Self-pay | Admitting: Vascular Surgery

## 2017-04-25 ENCOUNTER — Encounter: Payer: Self-pay | Admitting: Vascular Surgery

## 2017-04-25 ENCOUNTER — Ambulatory Visit (HOSPITAL_COMMUNITY)
Admission: RE | Admit: 2017-04-25 | Discharge: 2017-04-25 | Disposition: A | Discharge status: Home / Self Care | Payer: Self-pay | Source: Ambulatory Visit | Attending: Vascular Surgery | Admitting: Vascular Surgery

## 2017-04-25 ENCOUNTER — Ambulatory Visit (INDEPENDENT_AMBULATORY_CARE_PROVIDER_SITE_OTHER): Payer: Self-pay | Admitting: Vascular Surgery

## 2017-04-25 VITALS — BP 151/98 | HR 88 | Temp 97.7°F | Resp 18 | Ht 65.0 in | Wt 245.0 lb

## 2017-04-25 DIAGNOSIS — M25552 Pain in left hip: Secondary | ICD-10-CM | POA: Insufficient documentation

## 2017-04-25 DIAGNOSIS — I871 Compression of vein: Secondary | ICD-10-CM

## 2017-04-25 DIAGNOSIS — Z86718 Personal history of other venous thrombosis and embolism: Secondary | ICD-10-CM | POA: Insufficient documentation

## 2017-04-25 NOTE — Progress Notes (Signed)
Patient is a 51 year old female who is status post left iliac vein stenting March 2016. She still has some swelling in her left leg. The achiness and pain that she had in her left leg preoperatively has improved a little bit but she still wears her compression stocking religiously to improve swelling symptoms. She is no longer on Xarelto. She is on aspirin alone. She has been gaining some weight and we talked about several strategies for changes in her diet and exercise today.  Review of systems: She denies shortness of breath. She denies chest pain.  Past Medical History:  Diagnosis Date  . Asthma   . B12 deficiency   . Bell's palsy    right  . Chronic insomnia   . Chronic migraine 08/05/2014  . Complication of anesthesia    Woke up during Hysterectomy  . GERD (gastroesophageal reflux disease)   . Headache   . History of stroke    right hemisensory deficit  . Obstructive sleep apnea   . Pneumonia 2013  . Stroke [facility](HCC) 1989   "Bells palsy" shows up when very tired- right sie of mouth    Past Surgical History:  Procedure Laterality Date  . ABDOMINAL HYSTERECTOMY    . PARTIAL HYSTERECTOMY    . THYROIDECTOMY, PARTIAL      Current Outpatient Medications on File Prior to Visit  Medication Sig Dispense Refill  . albuterol (PROVENTIL HFA;VENTOLIN HFA) 108 (90 Base) MCG/ACT inhaler Inhale 2 puffs into the lungs every 6 (six) hours as needed for wheezing or shortness of breath. 1 Inhaler 0  . aspirin 325 MG tablet Take 325 mg by mouth daily.    . budesonide-formoterol (SYMBICORT) 160-4.5 MCG/ACT inhaler Inhale 2 puffs into the lungs 2 (two) times daily. 1 Inhaler 5  . cetirizine (ZYRTEC) 10 MG tablet Take 1 tablet (10 mg total) by mouth daily. (Patient not taking: Reported on 12/20/2016) 30 tablet 5  . fluticasone (FLONASE) 50 MCG/ACT nasal spray Place 2 sprays into both nostrils daily. (Patient not taking: Reported on 04/25/2017) 16 g 5  . furosemide (LASIX) 20 MG tablet Take 20 mg by mouth  2 (two) times daily.   3  . ketoconazole (NIZORAL) 2 % shampoo Apply 1 application topically 2 (two) times a week.    . loratadine (CLARITIN) 10 MG tablet Take 10 mg by mouth daily as needed for allergies.     . mometasone (NASONEX) 50 MCG/ACT nasal spray Place 2 sprays into the nose daily as needed (allergies).     . montelukast (SINGULAIR) 10 MG tablet Take 1 tablet (10 mg total) by mouth at bedtime as needed (allergies). (Patient not taking: Reported on 04/25/2017) 30 tablet 5  . nortriptyline (PAMELOR) 10 MG capsule Take one capsule at night for one week, then take 2 capsules at night for one week, then take 3 capsules at night (Patient not taking: Reported on 05/31/2016) 90 capsule 3  . olopatadine (PATANOL) 0.1 % ophthalmic solution 1 drop each eye daily as needed for itchy,watery, red eyes (Patient not taking: Reported on 12/20/2016) 5 mL 5  . omeprazole (PRILOSEC) 40 MG capsule Take 40 mg by mouth daily as needed (heartburn).   0  . potassium chloride (MICRO-K) 10 MEQ CR capsule Take 10 mEq by mouth daily.  3  . SUMAtriptan (IMITREX) 100 MG tablet Take 1 tablet (100 mg total) by mouth once as needed for migraine or headache. (Patient not taking: Reported on 12/20/2016) 10 tablet 2  . vitamin B-12 (CYANOCOBALAMIN) 1000  MCG tablet Take 1,000 mcg by mouth daily.    . Vitamin D, Ergocalciferol, (DRISDOL) 50000 units CAPS capsule TK 1 C PO 2 TIMES A WK  5  . XARELTO 20 MG TABS tablet TAKE 1 TABLET BY MOUTH DAILY WITH SUPPER (Patient not taking: Reported on 12/20/2016) 90 tablet 2   No current facility-administered medications on file prior to visit.     Physical exam:  Vitals:   04/25/17 0858 04/25/17 0902  BP: (!) 151/100 (!) 151/98  Pulse: 88   Resp: 18   Temp: 97.7 F (36.5 C)   TempSrc: Oral   SpO2: 100%   Weight: 245 lb (111.1 kg)   Height: 5\' 5"  (1.651 m)     Chest: Clear to auscultation bilaterally  Cardiac: Regular rate and rhythm  Abdomen: Soft nontender obese  Extremities:  Left leg is approximately 20% larger than the right leg. There is no pitting edema. She has 2+ dorsalis pedis pulses bilaterally. She has 2+ brachial and radial pulses.  Data: Patient had a duplex ultrasound of her iliac stent today which showed that this was widely patent.  Assessment: Chronic leg swelling but overall controlled with widely patent left iliac vein stent.  Plan: The patient will continue her aspirin daily. She has had no signs or symptoms of GI bleeding but I did discuss with her the possibility of taking an H2 blocker or proton pump inhibitor to decrease that risk. She will think about whether or not she wishes to do this. She will follow-up with us in one year for repeat duplex scan. She'll continue to wear her compression stockings. She is going to try to make some dietary changes to increase her protein intake decrease her fat intake from large amounts of peanut butter that she consumes. He is also going to try to start a walking program of vigorous walking 30 minutes daily.  Fabienne Brunsharles Fields, MD [facility] Office: [phone] Pager: [phone]

## 2017-05-02 NOTE — Addendum Note (Signed)
Addended by: Burton ApleyPETTY, Wagner Tanzi A on: 05/02/2017 02:24 PM   Modules accepted: Orders

## 2018-10-08 ENCOUNTER — Other Ambulatory Visit: Payer: Self-pay

## 2018-10-08 ENCOUNTER — Emergency Department (HOSPITAL_COMMUNITY): Payer: BLUE CROSS/BLUE SHIELD

## 2018-10-08 ENCOUNTER — Emergency Department (HOSPITAL_COMMUNITY)
Admission: EM | Admit: 2018-10-08 | Discharge: 2018-10-08 | Disposition: A | Discharge status: Home / Self Care | Payer: BLUE CROSS/BLUE SHIELD | Attending: Emergency Medicine | Admitting: Emergency Medicine

## 2018-10-08 ENCOUNTER — Encounter (HOSPITAL_COMMUNITY): Payer: Self-pay

## 2018-10-08 DIAGNOSIS — Z79899 Other long term (current) drug therapy: Secondary | ICD-10-CM | POA: Diagnosis not present

## 2018-10-08 DIAGNOSIS — R109 Unspecified abdominal pain: Secondary | ICD-10-CM

## 2018-10-08 DIAGNOSIS — Z7982 Long term (current) use of aspirin: Secondary | ICD-10-CM | POA: Insufficient documentation

## 2018-10-08 DIAGNOSIS — J45909 Unspecified asthma, uncomplicated: Secondary | ICD-10-CM | POA: Insufficient documentation

## 2018-10-08 DIAGNOSIS — R1031 Right lower quadrant pain: Secondary | ICD-10-CM | POA: Insufficient documentation

## 2018-10-08 DIAGNOSIS — Z8673 Personal history of transient ischemic attack (TIA), and cerebral infarction without residual deficits: Secondary | ICD-10-CM | POA: Diagnosis not present

## 2018-10-08 LAB — COMPREHENSIVE METABOLIC PANEL
ALT: 27 U/L (ref 0–44)
AST: 23 U/L (ref 15–41)
Albumin: 4.1 g/dL (ref 3.5–5.0)
Alkaline Phosphatase: 61 U/L (ref 38–126)
Anion gap: 7 (ref 5–15)
BUN: 14 mg/dL (ref 6–20)
CO2: 26 mmol/L (ref 22–32)
Calcium: 9.2 mg/dL (ref 8.9–10.3)
Chloride: 107 mmol/L (ref 98–111)
Creatinine, Ser: 0.7 mg/dL (ref 0.44–1.00)
GFR calc Af Amer: >60 mL/min (ref >60)
GFR calc non Af Amer: >60 mL/min (ref >60)
Glucose, Bld: 90 mg/dL (ref 70–99)
Potassium: 3.6 mmol/L (ref 3.5–5.1)
Sodium: 140 mmol/L (ref 135–145)
Total Bilirubin: 0.2 mg/dL — ABNORMAL LOW (ref 0.3–1.2)
Total Protein: 7.4 g/dL (ref 6.5–8.1)

## 2018-10-08 LAB — CBC WITH DIFFERENTIAL/PLATELET
Abs Immature Granulocytes: 0 10*3/uL (ref 0.00–0.07)
Basophils Absolute: 0 10*3/uL (ref 0.0–0.1)
Basophils Relative: 1 %
Eosinophils Absolute: 0.1 10*3/uL (ref 0.0–0.5)
Eosinophils Relative: 2 %
HCT: 37.7 % (ref 36.0–46.0)
Hemoglobin: 11.7 g/dL — ABNORMAL LOW (ref 12.0–15.0)
Immature Granulocytes: 0 %
Lymphocytes Relative: 43 %
Lymphs Abs: 2 10*3/uL (ref 0.7–4.0)
MCH: 28.2 pg (ref 26.0–34.0)
MCHC: 31 g/dL (ref 30.0–36.0)
MCV: 90.8 fL (ref 80.0–100.0)
Monocytes Absolute: 0.4 10*3/uL (ref 0.1–1.0)
Monocytes Relative: 8 %
Neutro Abs: 2.1 10*3/uL (ref 1.7–7.7)
Neutrophils Relative %: 46 %
Platelets: 281 10*3/uL (ref 150–400)
RBC: 4.15 MIL/uL (ref 3.87–5.11)
RDW: 13.3 % (ref 11.5–15.5)
WBC: 4.6 10*3/uL (ref 4.0–10.5)
nRBC: 0 % (ref 0.0–0.2)

## 2018-10-08 LAB — URINALYSIS, ROUTINE W REFLEX MICROSCOPIC
Bilirubin Urine: NEGATIVE
Glucose, UA: NEGATIVE mg/dL
Hgb urine dipstick: NEGATIVE
Ketones, ur: NEGATIVE mg/dL
Leukocytes,Ua: NEGATIVE
Nitrite: NEGATIVE
Protein, ur: NEGATIVE mg/dL
Specific Gravity, Urine: 1.016 (ref 1.005–1.030)
pH: 6 (ref 5.0–8.0)

## 2018-10-08 LAB — LIPASE, BLOOD: Lipase: 28 U/L (ref 11–51)

## 2018-10-08 NOTE — ED Provider Notes (Signed)
[facility]-EMERGENCY DEPT Provider Note   CSN: [id number] Arrival date & time: 10/08/18  1623    History   Chief Complaint Chief Complaint  Patient presents with  . Flank Pain    HPI Donna Murray is a 53 y.o. 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  Onset 2 weeks ago.  It is colicky, intermittent.  It feels like someone is pinching her, sharp.  Transient and lasts a few minutes.  It radiates to her navel.  Occurs randomly with movement, at rest.  Not necessarily worse every time she eats but this morning she had breakfast and noticed the return of pain.  Currently her pain is mild.  Associated with urinary frequency, darker urine.  Saturday she broke out in hot sweats.  Has noticed decreased bowel movements, now having a BM every other day when she typically goes every day.  She is also having to strain with BMs.  She went to urgent care who referred her to the ED for further evaluation.  No interventions for this.  No alleviating factors.  No associated fever, dysuria, nausea, vomiting, diarrhea, vaginal discharge.  No radiation into the chest or shortness of breath, pleuritic pain.  Status post hysterectomy.  Postmenopausal LMP 2002.  No history of kidney stones, kidney infections.  Denies distal paresthesias or loss of sensation to her extremities.    HPI  Past Medical History:  Diagnosis Date  . Asthma   . B12 deficiency   . Bell's palsy    right  . Chronic insomnia   . Chronic migraine 08/05/2014  . Complication of anesthesia    Woke up during Hysterectomy  . GERD (gastroesophageal reflux disease)   . Headache   . History of stroke    right hemisensory deficit  . Obstructive sleep apnea   . Pneumonia 2013  . Stroke [facility]) 1989   "Bells palsy" shows up when very tired- right sie of mouth    Patient Active Problem List   Diagnosis Date Noted  . Chronic migraine  08/05/2014  . Chronic insomnia 08/05/2014    Past Surgical History:  Procedure Laterality Date  . ABDOMINAL HYSTERECTOMY    . ANGIOPLASTY ILLIAC ARTERY Left 08/25/2014   Procedure: PERCUTANEOUS TRANSLUMINAL ANGIOPLASTY/STENT ILIAC VEIN;  Surgeon: Sherren Kernsharles E Fields, MD;  [facility];  Service: Vascular;  Laterality: Left;  . PARTIAL HYSTERECTOMY    . THYROIDECTOMY, PARTIAL    . VENOPLASTY Left 08/25/2014   Procedure: LOWER EXTREMITY VENOGRAM;  Surgeon: Sherren Kernsharles E Fields, MD;  [facility];  Service: Vascular;  Laterality: Left;     OB History   No obstetric history on file.      Home Medications    Prior to Admission medications   Medication Sig Start Date End Date Taking? Authorizing Provider  albuterol (PROVENTIL HFA;VENTOLIN HFA) 108 (90 Base) MCG/ACT inhaler Inhale 2 puffs into the lungs every 6 (six) hours as needed for wheezing or shortness of breath. 06/29/16  Yes Padgett, Pilar GrammesShaylar Patricia, MD  aspirin 325 MG EC tablet Take 325 mg by mouth daily.   Yes [provider]  budesonide-formoterol (SYMBICORT) 160-4.5 MCG/ACT inhaler Inhale 2 puffs into the lungs 2 (two) times daily. Patient taking differently: Inhale 2 puffs into the lungs 2 (two) times daily as needed (sob and wheezing).  05/31/16  Yes Marcelyn BruinsPadgett, Shaylar Patricia, MD  cetirizine (ZYRTEC) 10 MG tablet Take 1 tablet (10  mg total) by mouth daily. 05/31/16  Yes Padgett, Pilar Grammes, MD  furosemide (LASIX) 20 MG tablet Take 20 mg by mouth daily as needed for fluid.  07/08/14  Yes [provider]  montelukast (SINGULAIR) 10 MG tablet Take 1 tablet (10 mg total) by mouth at bedtime as needed (allergies). 05/31/16  Yes Padgett, Pilar Grammes, MD  fluticasone [facility]) 50 MCG/ACT nasal spray Place 2 sprays into both nostrils daily. Patient not taking: Reported on 04/25/2017 05/31/16   Marcelyn Bruins, MD  nortriptyline (PAMELOR) 10 MG capsule Take one capsule at night for one week, then take 2  capsules at night for one week, then take 3 capsules at night Patient not taking: Reported on 05/31/2016 08/05/14   York Spaniel, MD  olopatadine (PATANOL) 0.1 % ophthalmic solution 1 drop each eye daily as needed for itchy,watery, red eyes Patient not taking: Reported on 12/20/2016 06/05/16   Marcelyn Bruins, MD  omeprazole (PRILOSEC) 40 MG capsule Take 40 mg by mouth daily as needed (heartburn).  07/07/14   [provider]  SUMAtriptan (IMITREX) 100 MG tablet Take 1 tablet (100 mg total) by mouth once as needed for migraine or headache. Patient not taking: Reported on 12/20/2016 08/05/14   York Spaniel, MD  Vitamin D, Ergocalciferol, (DRISDOL) 50000 units CAPS capsule Take 50,000 Units by mouth every 7 (seven) days.  05/20/16   [provider]    Family History Family History  Problem Relation Age of Onset  . Brain cancer Mother   . Allergic rhinitis Mother   . Asthma Mother   . Hypertension Father   . Diabetes Father   . Heart disease Father   . Asthma Father   . Migraines Sister   . Brain cancer Maternal Aunt   . Brain cancer Maternal Aunt   . Migraines Sister     Social History Social History   Tobacco Use  . Smoking status: Never Smoker  . Smokeless tobacco: Never Used  Substance Use Topics  . Alcohol use: Yes    Alcohol/week: 2.0 standard drinks    Types: 2 Glasses of wine per week    Comment: occasional wine  . Drug use: No     Allergies   Patient has no known allergies.   Review of Systems Review of Systems  Genitourinary: Positive for flank pain and frequency.  All other systems reviewed and are negative.    Physical Exam Updated Vital Signs BP (!) 147/91 (BP Location: Left Arm)   Pulse 90   Temp 98.4 F (36.9 C) (Oral)   Resp 16   Ht  (1.651 m)   Wt 112 kg   SpO2 100%   BMI 41.10 kg/m   Physical Exam Vitals signs and nursing note reviewed.  Constitutional:      Appearance: She is well-developed.      Comments: Non toxic in NAD  HENT:     Head: Normocephalic and atraumatic.     Nose: Nose normal.  Eyes:     Conjunctiva/sclera: Conjunctivae normal.  Neck:     Musculoskeletal: Normal range of motion.  Cardiovascular:     Rate and Rhythm: Normal rate and regular rhythm.     Comments: 1+ radial and DP pulses bilaterally. Pulmonary:     Effort: Pulmonary effort is normal.     Breath sounds: Normal breath sounds.  Abdominal:     General: Bowel sounds are normal.     Palpations: Abdomen is soft.  Tenderness: There is abdominal tenderness in the suprapubic area. There is right CVA tenderness.     Comments: No G/R/R. Negative Murphy's and McBurney's.  Obese abdomen.  Bowel sounds to lower quadrants are distant.  No pulsatility.  Musculoskeletal: Normal range of motion.        General: Tenderness present.     Comments: Mild right flank and thoracic muscular tenderness  Skin:    General: Skin is warm and dry.     Capillary Refill: Capillary refill takes less than 2 seconds.     Comments: No rash to flank/back   Neurological:     Mental Status: She is alert.  Psychiatric:        Behavior: Behavior normal.      ED Treatments / Results  Labs (all labs ordered are listed, but only abnormal results are displayed) Labs Reviewed  CBC WITH DIFFERENTIAL/PLATELET - Abnormal; Notable for the following components:      Result Value   Hemoglobin 11.7 (*)    All other components within normal limits  COMPREHENSIVE METABOLIC PANEL - Abnormal; Notable for the following components:   Total Bilirubin 0.2 (*)    All other components within normal limits  URINE CULTURE  LIPASE, BLOOD  URINALYSIS, ROUTINE W REFLEX MICROSCOPIC    EKG None  Radiology Ct Renal Stone Study  Result Date: 10/08/2018 CLINICAL DATA:  Right-sided flank pain EXAM: CT ABDOMEN AND PELVIS WITHOUT CONTRAST TECHNIQUE: Multidetector CT imaging of the abdomen and pelvis was performed following the standard protocol  without IV contrast. COMPARISON:  None. FINDINGS: Lower chest: No acute abnormality. Hepatobiliary: No focal liver abnormality is seen. No gallstones, gallbladder wall thickening, or biliary dilatation. Pancreas: Unremarkable. No pancreatic ductal dilatation or surrounding inflammatory changes. Spleen: Normal in size without focal abnormality. Adrenals/Urinary Tract: Adrenal glands are unremarkable. Kidneys are normal, without renal calculi, focal lesion, or hydronephrosis. Bladder is unremarkable. Stomach/Bowel: Small amount of radiopaque material within the stomach. No dilated small bowel. No bowel wall thickening. Negative appendix. Few sigmoid colon diverticula. Vascular/Lymphatic: Left iliac vein stent. Nonaneurysmal aorta. No significantly enlarged lymph nodes Reproductive: Status post hysterectomy. No adnexal masses. Other: Negative for free air or free fluid Musculoskeletal: Degenerative changes of the spine. No acute or suspicious abnormality IMPRESSION: 1. No CT evidence for acute intra-abdominal or pelvic abnormality. 2. Negative for hydronephrosis or ureteral stone Electronically Signed   By: Jasmine Pang M.D.   On: 10/08/2018 17:44    Procedures Procedures (including critical care time)  Medications Ordered in ED Medications - No data to display   Initial Impression / Assessment and Plan / ED Course  I have reviewed the triage vital signs and the nursing notes.  Pertinent labs & imaging results that were available during my care of the patient were reviewed by me and considered in my medical decision making (see chart for details).  Clinical Course as of Oct 08 1922  Wed Oct 08, 2018  1858 Reassessed patient. Discussed labs and CT findings. Continues to deny fever, Cp, SOB, cough, n/v/d. No overlaying rash. She did heavy lifting  3 weeks ago when she moved into a smaller home.    [CG]    Clinical Course User Index [CG] Liberty Handy, PA-C      ddx includes pyelonephritis  vs UTI/pyelonephritis.  Given chronicity of pain x 2 weeks, and benign symptomatology and exam lower suspicion for SBO, dissection, cholecystitis, appendicitis.  She has no associated CP, SOB, pulse deficits or paresthesias distally.  No  overlaying rash to flank. HD stable. Boderline hypertensive on arrival but has not taken any of her meds. Will plan on labs, UA, CT renal, reassess.   1919: Labs, UA, CT renal vastly reassuring.  Given symptoms will add urine culture. Discussed results with patient. Reevaluated patient again and explained R flank pain of unclear etiology at this time.  Again with R CVAT and tenderness with palpation to the area, but mild. Negative Murphy's and McBurney's.  ROS done again and all negative. She remembers heavy lifting 3 weeks ago.  Pain could be muscular.  Given normal VS, exam, benign history and work up here I recommended dc with tylenol and close monitoring of her symptoms. Strict return precautions given. She is comfortable with this plan.   Final Clinical Impressions(s) / ED Diagnoses   Final diagnoses:  Flank pain    ED Discharge Orders    None       Jerrell Mylar 10/08/18 1924    Wynetta Fines, MD 10/08/18 2246

## 2018-10-08 NOTE — ED Notes (Signed)
Pt ambulatory to CT

## 2018-10-08 NOTE — Discharge Instructions (Signed)
You were seen in the ED for right flank pain that went into your bellybutton.  Lab work, urinalysis, CT was normal.  Given your recent possible heavy lifting during your move I suspect this may be muscular.  I recommend 500 to 1000 mg of acetaminophen (Tylenol) every 6-8 hours.  Monitor your symptoms closely.  Return to the ED for fever greater than 100, nausea, vomiting, chest pain, shortness of breath, new rash, localized abdominal pain to the right upper and right lower quadrants, changes to your bowel movements.

## 2018-10-08 NOTE — ED Triage Notes (Addendum)
Pt sent from [facility]. Pt states that she has been having right sided flank pain x 2 weeks. Pt is concerned for kidney stones. Pt states she has been voiding more than usual

## 2018-10-10 LAB — URINE CULTURE: Culture: NO GROWTH

## 2019-08-29 IMAGING — CT CT RENAL STONE PROTOCOL
2 of 4 series · 16 of 46 positions shown, 18 images · non-contrast
Comparison: None.

CLINICAL DATA: Right-sided flank pain

EXAM:
CT ABDOMEN AND PELVIS WITHOUT CONTRAST
TECHNIQUE: Multidetector CT imaging of the abdomen and pelvis was performed
following the standard protocol without IV contrast.

[Series 2: axial st · axial · 0.70mm/px · z∈[-521,-81]mm · 13 of 100 slices shown, 15 images]
[im 6/100  soft-tissue]
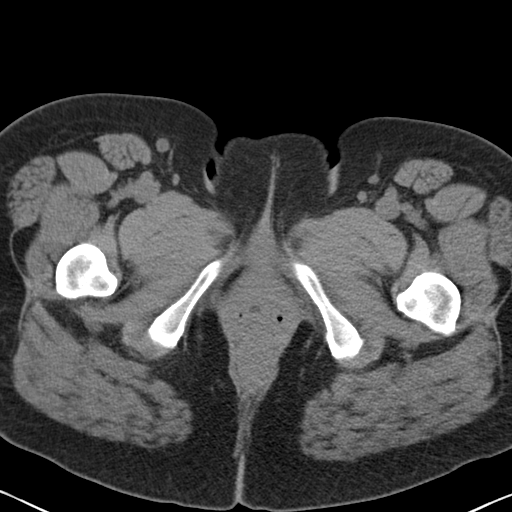
[im 6/100  bone]
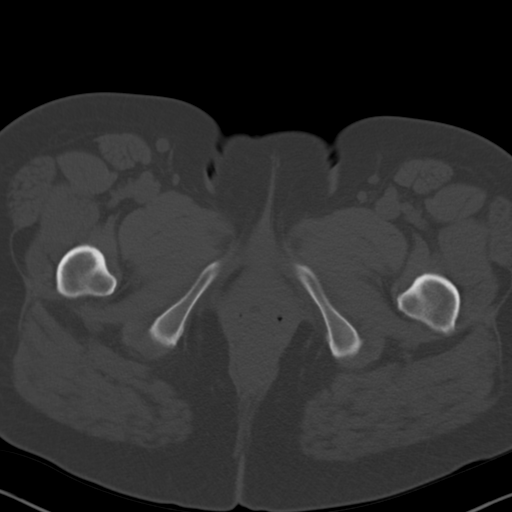
[im 12/100  soft-tissue]
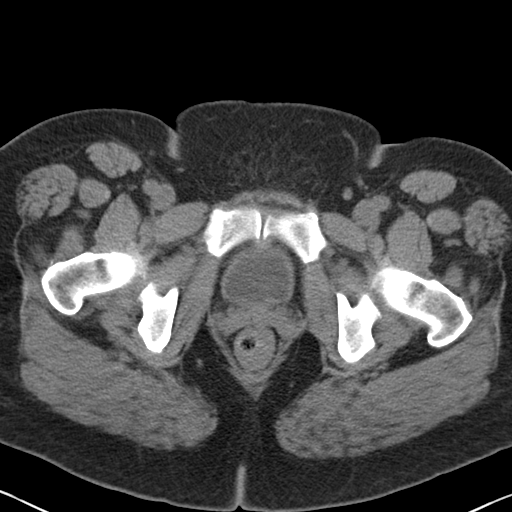
[im 24/100  soft-tissue]
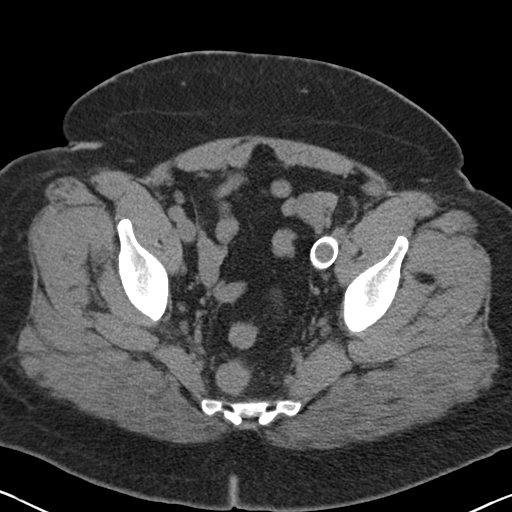
[im 30/100  soft-tissue]
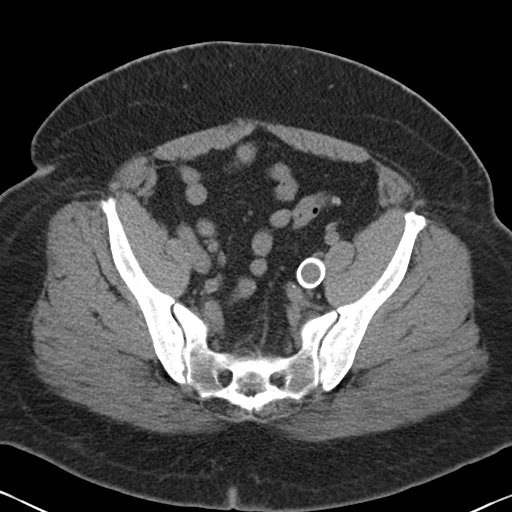
[im 35/100  soft-tissue]
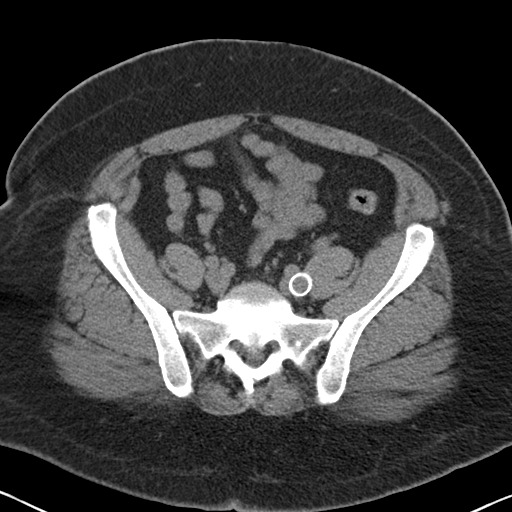
[im 41/100  soft-tissue]
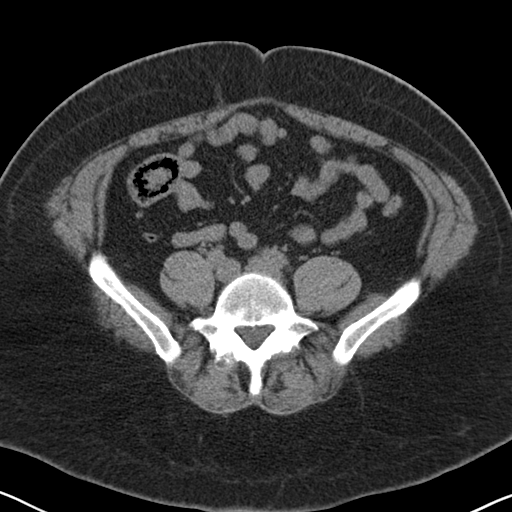
[im 53/100  soft-tissue]
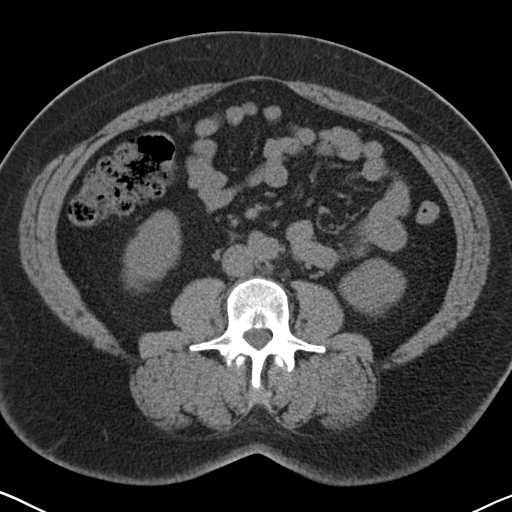
[im 59/100  soft-tissue]
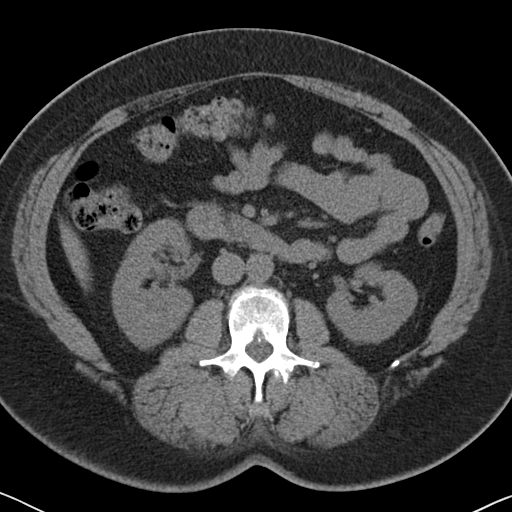
[im 65/100  soft-tissue]
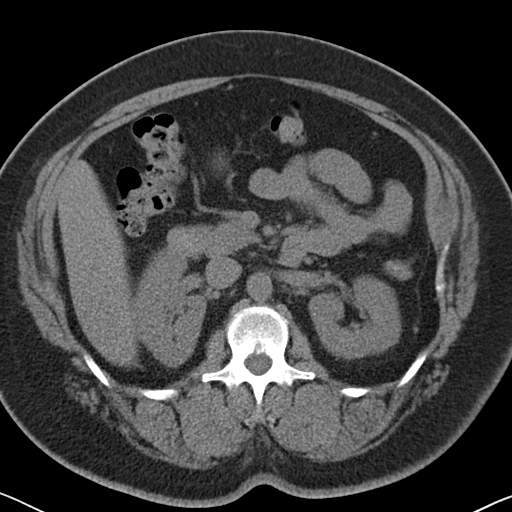
[im 65/100  bone]
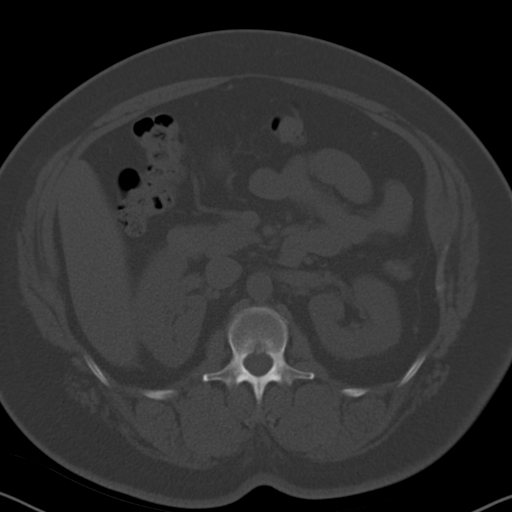
[im 70/100  soft-tissue]
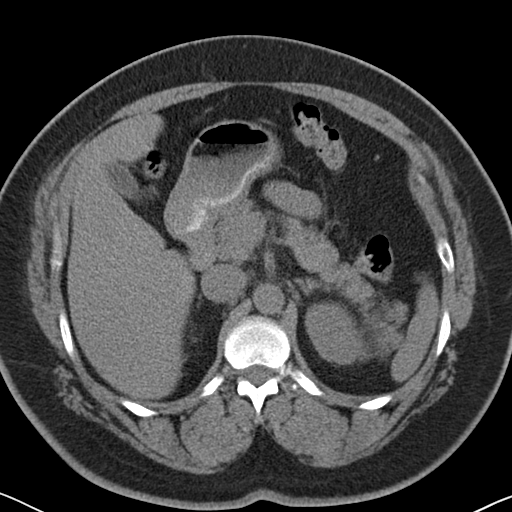
[im 76/100  soft-tissue]
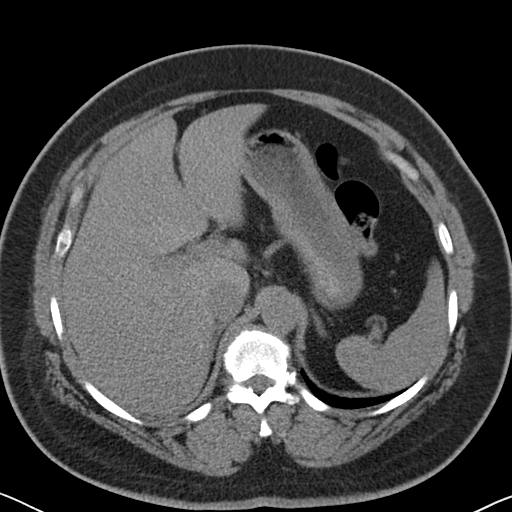
[im 88/100  soft-tissue]
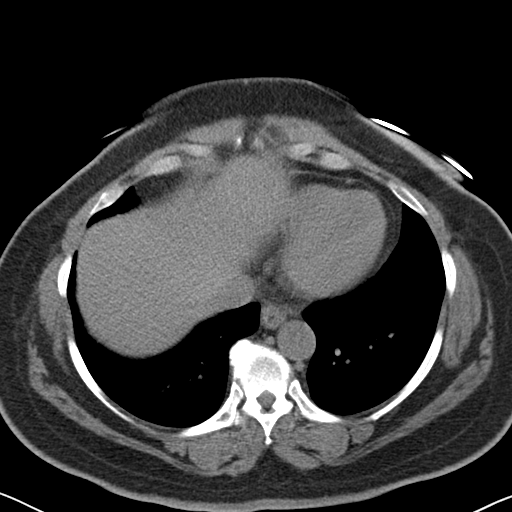
[im 94/100  soft-tissue]
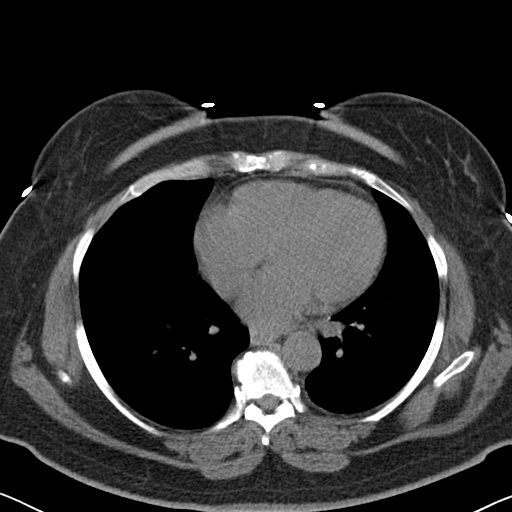

[Series 4: coronal · coronal · 0.73mm/px · 3 of 152 slices shown]
[im 51/152  soft-tissue]
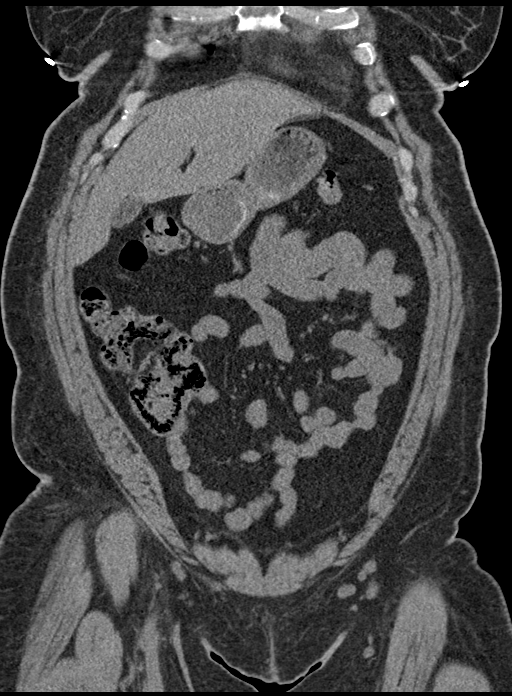
[im 68/152  soft-tissue]
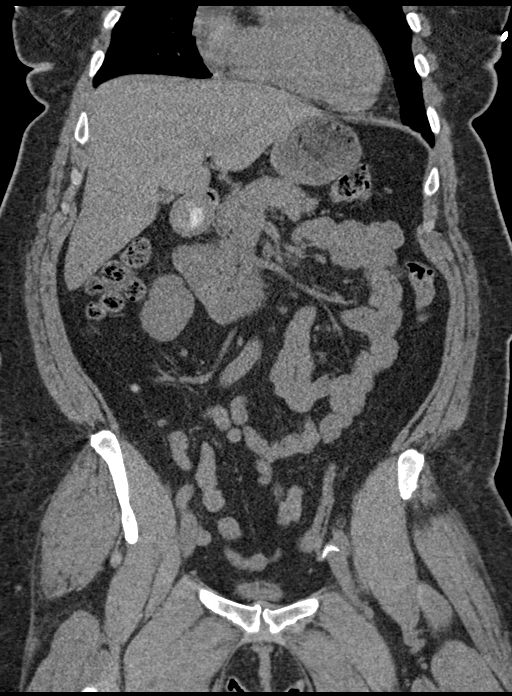
[im 84/152  soft-tissue]
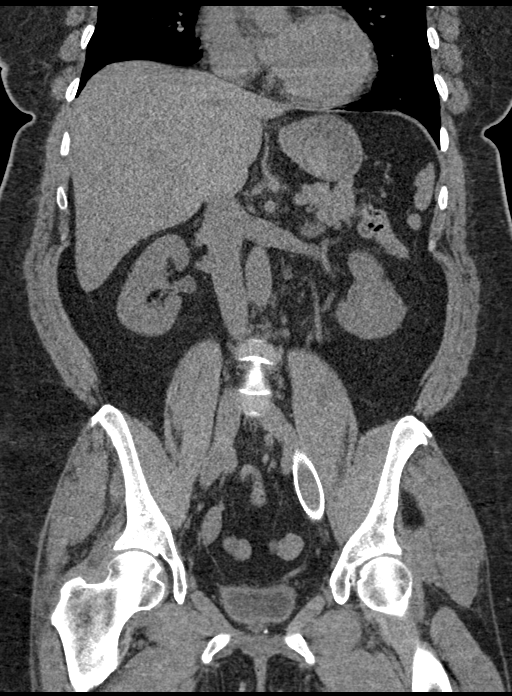

[16 of 46 positions shown; findings below may reference images not displayed]

FINDINGS: Lower chest: No acute abnormality.

Hepatobiliary: No focal liver abnormality is seen. No gallstones,
gallbladder wall thickening, or biliary dilatation.

Pancreas: Unremarkable. No pancreatic ductal dilatation or
surrounding inflammatory changes.

Spleen: Normal in size without focal abnormality.

Adrenals/Urinary Tract: Adrenal glands are unremarkable. Kidneys are
normal, without renal calculi, focal lesion, or hydronephrosis.
Bladder is unremarkable.

Stomach/Bowel: Small amount of radiopaque material within the
stomach. No dilated small bowel. No bowel wall thickening. Negative
appendix. Few sigmoid colon diverticula.

Vascular/Lymphatic: Left iliac vein stent. Nonaneurysmal aorta. No
significantly enlarged lymph nodes

Reproductive: Status post hysterectomy. No adnexal masses.

Other: Negative for free air or free fluid

Musculoskeletal: Degenerative changes of the spine. No acute or
suspicious abnormality
IMPRESSION: 1. No CT evidence for acute intra-abdominal or pelvic abnormality.
2. Negative for hydronephrosis or ureteral stone
